# Patient Record
Sex: Male | Born: 1965 | Race: White | Hispanic: No | Marital: Single | Smoking: Current every day smoker
Health system: Southern US, Community
[De-identification: ages and names within clinical notes are randomized; demographics above are authoritative.]

## PROBLEM LIST (undated history)

## (undated) DIAGNOSIS — F17200 Nicotine dependence, unspecified, uncomplicated: Secondary | ICD-10-CM

## (undated) HISTORY — PX: ORIF RADIUS & ULNA FRACTURES: SHX2129

## (undated) HISTORY — PX: SKIN GRAFT: SHX250

---

## 2008-05-25 ENCOUNTER — Emergency Department: Payer: Self-pay | Admitting: Internal Medicine

## 2008-07-09 ENCOUNTER — Emergency Department: Payer: Self-pay | Admitting: Emergency Medicine

## 2010-07-04 ENCOUNTER — Emergency Department: Payer: Self-pay | Admitting: Emergency Medicine

## 2015-03-08 ENCOUNTER — Emergency Department
Admit: 2015-03-08 | Disposition: A | Discharge status: Home / Self Care | Payer: Self-pay | Admitting: Emergency Medicine

## 2016-01-25 ENCOUNTER — Emergency Department: Payer: No Typology Code available for payment source

## 2016-01-25 ENCOUNTER — Emergency Department
Admission: EM | Admit: 2016-01-25 | Discharge: 2016-01-26 | Disposition: A | Discharge status: Home / Self Care | Payer: No Typology Code available for payment source | Attending: Emergency Medicine | Admitting: Emergency Medicine

## 2016-01-25 DIAGNOSIS — IMO0002 Reserved for concepts with insufficient information to code with codable children: Secondary | ICD-10-CM

## 2016-01-25 DIAGNOSIS — S199XXA Unspecified injury of neck, initial encounter: Secondary | ICD-10-CM | POA: Diagnosis not present

## 2016-01-25 DIAGNOSIS — F10920 Alcohol use, unspecified with intoxication, uncomplicated: Secondary | ICD-10-CM

## 2016-01-25 DIAGNOSIS — Z23 Encounter for immunization: Secondary | ICD-10-CM | POA: Insufficient documentation

## 2016-01-25 DIAGNOSIS — S0990XA Unspecified injury of head, initial encounter: Secondary | ICD-10-CM | POA: Diagnosis not present

## 2016-01-25 DIAGNOSIS — S52292A Other fracture of shaft of left ulna, initial encounter for closed fracture: Secondary | ICD-10-CM | POA: Insufficient documentation

## 2016-01-25 DIAGNOSIS — S01312A Laceration without foreign body of left ear, initial encounter: Secondary | ICD-10-CM | POA: Insufficient documentation

## 2016-01-25 DIAGNOSIS — Y92410 Unspecified street and highway as the place of occurrence of the external cause: Secondary | ICD-10-CM | POA: Insufficient documentation

## 2016-01-25 DIAGNOSIS — Y9389 Activity, other specified: Secondary | ICD-10-CM | POA: Diagnosis not present

## 2016-01-25 DIAGNOSIS — Y998 Other external cause status: Secondary | ICD-10-CM | POA: Diagnosis not present

## 2016-01-25 DIAGNOSIS — S01111A Laceration without foreign body of right eyelid and periocular area, initial encounter: Secondary | ICD-10-CM | POA: Diagnosis not present

## 2016-01-25 DIAGNOSIS — F10120 Alcohol abuse with intoxication, uncomplicated: Secondary | ICD-10-CM | POA: Insufficient documentation

## 2016-01-25 DIAGNOSIS — Z880 Allergy status to penicillin: Secondary | ICD-10-CM | POA: Diagnosis not present

## 2016-01-25 DIAGNOSIS — S59912A Unspecified injury of left forearm, initial encounter: Secondary | ICD-10-CM | POA: Diagnosis present

## 2016-01-25 DIAGNOSIS — S52202A Unspecified fracture of shaft of left ulna, initial encounter for closed fracture: Secondary | ICD-10-CM

## 2016-01-25 MED ORDER — TETANUS-DIPHTH-ACELL PERTUSSIS 5-2.5-18.5 LF-MCG/0.5 IM SUSP
0.5000 mL | Freq: Once | INTRAMUSCULAR | Status: AC
  Administered 2016-01-25: 0.5 mL via INTRAMUSCULAR
  Filled 2016-01-25: qty 0.5

## 2016-01-25 MED ORDER — HALOPERIDOL LACTATE 5 MG/ML IJ SOLN
5.0000 mg | Freq: Once | INTRAMUSCULAR | Status: AC
  Administered 2016-01-25: 5 mg via INTRAMUSCULAR

## 2016-01-25 MED ORDER — DIPHENHYDRAMINE HCL 50 MG/ML IJ SOLN
50.0000 mg | Freq: Once | INTRAMUSCULAR | Status: AC
  Administered 2016-01-25: 50 mg via INTRAMUSCULAR

## 2016-01-25 MED ORDER — DIPHENHYDRAMINE HCL 50 MG/ML IJ SOLN
INTRAMUSCULAR | Status: AC
  Administered 2016-01-25: 50 mg via INTRAMUSCULAR
  Filled 2016-01-25: qty 1

## 2016-01-25 MED ORDER — LORAZEPAM 2 MG/ML IJ SOLN
2.0000 mg | Freq: Once | INTRAMUSCULAR | Status: AC
  Administered 2016-01-25: 2 mg via INTRAMUSCULAR

## 2016-01-25 NOTE — ED Notes (Signed)
[facility] in room with patient at this time
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
## 2016-01-25 NOTE — ED Notes (Signed)
Patient transported to x-ray. ?

## 2016-01-25 NOTE — ED Notes (Signed)
Blood draw performed for [facility]. Pt tolerated it well.

## 2016-01-26 MED ORDER — IBUPROFEN 800 MG PO TABS
800.0000 mg | ORAL_TABLET | Freq: Once | ORAL | Status: AC
  Administered 2016-01-26: 800 mg via ORAL
  Filled 2016-01-26: qty 1

## 2016-01-26 NOTE — ED Notes (Signed)
Pt requested water. Pt provided water to drink. Pt laying on stretcher, both side rails up. Pt resting at this time.

## 2016-01-26 NOTE — Discharge Instructions (Signed)
Alcohol Intoxication Alcohol intoxication occurs when you drink enough alcohol that it affects your ability to function. It can be mild or very severe. Drinking a lot of alcohol in a short time is called binge drinking. This can be very harmful. Drinking alcohol can also be more dangerous if you are taking medicines or other drugs. Some of the effects caused by alcohol may include:  Loss of coordination.  Changes in mood and behavior.  Unclear thinking.  Trouble talking (slurred speech).  Throwing up (vomiting).  Confusion.  Slowed breathing.  Twitching and shaking (seizures).  Loss of consciousness. HOME CARE  Do not drive after drinking alcohol.  Drink enough water and fluids to keep your pee (urine) clear or pale yellow. Avoid caffeine.  Only take medicine as told by your doctor. GET HELP IF:  You throw up (vomit) many times.  You do not feel better after a few days.  You frequently have alcohol intoxication. Your doctor can help decide if you should see a substance use treatment counselor. GET HELP RIGHT AWAY IF:  You become shaky when you stop drinking.  You have twitching and shaking.  You throw up blood. It may look bright red or like coffee grounds.  You notice blood in your poop (bowel movements).  You become lightheaded or pass out (faint). MAKE SURE YOU:   Understand these instructions.  Will watch your condition.  Will get help right away if you are not doing well or get worse.   This information is not intended to replace advice given to you by your health care provider. Make sure you discuss any questions you have with your health care provider.   Document Released: 04/15/2008 Document Revised: 06/30/2013 Document Reviewed: 04/02/2013 Elsevier Interactive Patient Education 2016 Elsevier Inc.  Cast or Splint Care Casts and splints support injured limbs and keep bones from moving while they heal.  HOME CARE  Keep the cast or splint uncovered  during the drying period.  A plaster cast can take 24 to 48 hours to dry.  A fiberglass cast will dry in less than 1 hour.  Do not rest the cast on anything harder than a pillow for 24 hours.  Do not put weight on your injured limb. Do not put pressure on the cast. Wait for your doctor's approval.  Keep the cast or splint dry.  Cover the cast or splint with a plastic bag during baths or wet weather.  If you have a cast over your chest and belly (trunk), take sponge baths until the cast is taken off.  If your cast gets wet, dry it with a towel or blow dryer. Use the cool setting on the blow dryer.  Keep your cast or splint clean. Wash a dirty cast with a damp cloth.  Do not put any objects under your cast or splint.  Do not scratch the skin under the cast with an object. If itching is a problem, use a blow dryer on a cool setting over the itchy area.  Do not trim or cut your cast.  Do not take out the padding from inside your cast.  Exercise your joints near the cast as told by your doctor.  Raise (elevate) your injured limb on 1 or 2 pillows for the first 1 to 3 days. GET HELP IF:  Your cast or splint cracks.  Your cast or splint is too tight or too loose.  You itch badly under the cast.  Your cast gets wet or has  a soft spot.  You have a bad smell coming from the cast.  You get an object stuck under the cast.  Your skin around the cast becomes red or sore.  You have new or more pain after the cast is put on. GET HELP RIGHT AWAY IF:  You have fluid leaking through the cast.  You cannot move your fingers or toes.  Your fingers or toes turn blue or white or are cool, painful, or puffy (swollen).  You have tingling or lose feeling (numbness) around the injured area.  You have bad pain or pressure under the cast.  You have trouble breathing or have shortness of breath.  You have chest pain.   This information is not intended to replace advice given to you  by your health care provider. Make sure you discuss any questions you have with your health care provider.   Document Released: 02/27/2011 Document Revised: 06/30/2013 Document Reviewed: 05/06/2013 Elsevier Interactive Patient Education 2016 Elsevier Inc.  Forearm Fracture A forearm fracture is a break in one or both of the bones of your arm that are between the elbow and the wrist. Your forearm is made up of two bones:  Radius. This is the bone on the inside of your arm near your thumb.  Ulna. This is the bone on the outside of your arm near your little finger. Middle forearm fractures usually break both the radius and the ulna. Most forearm fractures that involve both the ulna and radius will require surgery. CAUSES Common causes of this type of fracture include:  Falling on an outstretched arm.  Accidents, such as a car or bike accident.  A hard, direct hit to the middle part of your arm. RISK FACTORS You may be at higher risk for this type of fracture if:  You play contact sports.  You have a condition that causes your bones to be weak or thin (osteoporosis). SIGNS AND SYMPTOMS A forearm fracture causes pain immediately after the injury. Other signs and symptoms include:  An abnormal bend or bump in your arm (deformity).  Swelling.  Numbness or tingling.  Tenderness.  Inability to turn your hand from side to side (rotate).  Bruising. DIAGNOSIS Your health care provider may diagnose a forearm fracture based on:  Your symptoms.  Your medical history, including any recent injury.  A physical exam. Your health care provider will look for any deformity and feel for tenderness over the break. Your health care provider will also check whether the bones are out of place.  An X-ray exam to confirm the diagnosis and learn more about the type of fracture. TREATMENT The goals of treatment are to get the bone or bones in proper position for healing and to keep the bones  from moving so they will heal over time. Your treatment will depend on many factors, especially the type of fracture that you have.  If the fractured bone or bones:  Are in the correct position (nondisplaced), you may only need to wear a cast or a splint.  Have a slightly displaced fracture, you may need to have the bones moved back into place manually (closed reduction) before the splint or cast is put on.  You may have a temporary splint before you have a cast. The splint allows room for some swelling. After a few days, a cast can replace the splint.  You may have to wear the cast for 6-8 weeks or as directed by your health care provider.  The cast  may be changed after about 3 weeks or as directed by your health care provider.  After your cast is removed, you may need physical therapy to regain full movement in your wrist or elbow.  You may need emergency surgery if you have:  A fractured bone or bones that are out of position (displaced).  A fracture with multiple fragments (comminuted fracture).  A fracture that breaks the skin (open fracture). This type of fracture may require surgical wires, plates, or screws to hold the bone or bones in place.  You may have X-rays every couple of weeks to check on your healing. HOME CARE INSTRUCTIONS If You Have a Cast:  Do not stick anything inside the cast to scratch your skin. Doing that increases your risk of infection.  Check the skin around the cast every day. Report any concerns to your health care provider. You may put lotion on dry skin around the edges of the cast. Do not apply lotion to the skin underneath the cast. If You Have a Splint:  Wear it as directed by your health care provider. Remove it only as directed by your health care provider.  Loosen the splint if your fingers become numb and tingle, or if they turn cold and blue. Bathing  Cover the cast or splint with a watertight plastic bag to protect it from water while  you bathe or shower. Do not let the cast or splint get wet. Managing Pain, Stiffness, and Swelling  If directed, apply ice to the injured area:  Put ice in a plastic bag.  Place a towel between your skin and the bag.  Leave the ice on for 20 minutes, 2-3 times a day.  Move your fingers often to avoid stiffness and to lessen swelling.  Raise the injured area above the level of your heart while you are sitting or lying down. Driving  Do not drive or operate heavy machinery while taking pain medicine.  Do not drive while wearing a cast or splint on a hand that you use for driving. Activity  Return to your normal activities as directed by your health care provider. Ask your health care provider what activities are safe for you.  Perform range-of-motion exercises only as directed by your health care provider. Safety  Do not use your injured limb to support your body weight until your health care provider says that you can. General Instructions  Do not put pressure on any part of the cast or splint until it is fully hardened. This may take several hours.  Keep the cast or splint clean and dry.  Do not use any tobacco products, including cigarettes, chewing tobacco, or electronic cigarettes. Tobacco can delay bone healing. If you need help quitting, ask your health care provider.  Take medicines only as directed by your health care provider.  Keep all follow-up visits as directed by your health care provider. This is important. SEEK MEDICAL CARE IF:  Your pain medicine is not helping.  Your cast or splint becomes wet or damaged or suddenly feels too tight.  Your cast becomes loose.  You have more severe pain or swelling than you did before the cast.  You have severe pain when you stretch your fingers.  You continue to have pain or stiffness in your elbow or your wrist after your cast is removed. SEEK IMMEDIATE MEDICAL CARE IF:  You cannot move your fingers.  You lose  feeling in your fingers or your hand.  Your hand or your  fingers turn cold and pale or blue.  You notice a bad smell coming from your cast.  You have drainage from underneath your cast.  You have new stains from blood or drainage that is coming through your cast.   This information is not intended to replace advice given to you by your health care provider. Make sure you discuss any questions you have with your health care provider.   Document Released: 10/25/2000 Document Revised: 11/18/2014 Document Reviewed: 06/13/2014 Elsevier Interactive Patient Education [employer]

## 2016-01-26 NOTE — ED Notes (Signed)
Pt is sleeping in stretcher with side rails up, warm blanket covering patient. Equal rise and fall of chest noted, no distress at this time.

## 2016-01-26 NOTE — ED Notes (Signed)
Patient given instructions on follow-up with Dr. Martha ClanKrasinski, to not mix vicodin with alcohol or drive while intoxicated with alcohol or other substances including pain medication.
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
## 2016-01-26 NOTE — ED Notes (Signed)
Pt is attempting to call a ride home.
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
## 2016-05-07 ENCOUNTER — Encounter: Payer: Self-pay | Admitting: Emergency Medicine

## 2016-05-07 ENCOUNTER — Emergency Department
Admission: EM | Admit: 2016-05-07 | Discharge: 2016-05-07 | Disposition: A | Discharge status: Home / Self Care | Payer: No Typology Code available for payment source | Attending: Emergency Medicine | Admitting: Emergency Medicine

## 2016-05-07 DIAGNOSIS — Z0189 Encounter for other specified special examinations: Secondary | ICD-10-CM

## 2016-05-07 DIAGNOSIS — L03311 Cellulitis of abdominal wall: Secondary | ICD-10-CM | POA: Insufficient documentation

## 2016-05-07 DIAGNOSIS — Z7689 Persons encountering health services in other specified circumstances: Secondary | ICD-10-CM

## 2016-05-07 DIAGNOSIS — F17200 Nicotine dependence, unspecified, uncomplicated: Secondary | ICD-10-CM | POA: Insufficient documentation

## 2016-05-07 MED ORDER — HYDROCODONE-ACETAMINOPHEN 5-325 MG PO TABS
ORAL_TABLET | ORAL | Status: AC
  Administered 2016-05-07: 1 via ORAL
  Filled 2016-05-07: qty 1

## 2016-05-07 MED ORDER — HYDROCODONE-ACETAMINOPHEN 5-325 MG PO TABS: 1.0000 | ORAL_TABLET | Freq: Three times a day (TID) | ORAL | Status: DC | PRN

## 2016-05-07 MED ORDER — LIDOCAINE-EPINEPHRINE (PF) 1 %-1:200000 IJ SOLN
INTRAMUSCULAR | Status: AC
  Filled 2016-05-07: qty 30

## 2016-05-07 MED ORDER — CLINDAMYCIN HCL 300 MG PO CAPS: 300.0000 mg | ORAL_CAPSULE | Freq: Four times a day (QID) | ORAL | Status: DC

## 2016-05-07 MED ORDER — LIDOCAINE-EPINEPHRINE 2 %-1:100000 IJ SOLN
1.7000 mL | Freq: Once | INTRAMUSCULAR | Status: AC
  Administered 2016-05-07: 1.7 mL

## 2016-05-07 MED ORDER — CLINDAMYCIN HCL 150 MG PO CAPS
300.0000 mg | ORAL_CAPSULE | Freq: Once | ORAL | Status: AC
  Administered 2016-05-07: 300 mg via ORAL
  Filled 2016-05-07: qty 2

## 2016-05-07 MED ORDER — HYDROCODONE-ACETAMINOPHEN 5-325 MG PO TABS
1.0000 | ORAL_TABLET | Freq: Once | ORAL | Status: AC
  Administered 2016-05-07: 1 via ORAL

## 2016-05-07 NOTE — ED Notes (Signed)
Patient states he was prescribed antibiotic yesterday, but couldn't get the prescription filled due to finances.

## 2016-05-07 NOTE — Discharge Instructions (Signed)
Cellulitis Cellulitis is an infection of the skin and the tissue beneath it. The infected area is usually red and tender. Cellulitis occurs most often in the arms and lower legs.  CAUSES  Cellulitis is caused by bacteria that enter the skin through cracks or cuts in the skin. The most common types of bacteria that cause cellulitis are staphylococci and streptococci. SIGNS AND SYMPTOMS   Redness and warmth.  Swelling.  Tenderness or pain.  Fever. DIAGNOSIS  Your health care provider can usually determine what is wrong based on a physical exam. Blood tests may also be done. TREATMENT  Treatment usually involves taking an antibiotic medicine. HOME CARE INSTRUCTIONS   Take your antibiotic medicine as directed by your health care provider. Finish the antibiotic even if you start to feel better.  Keep the infected arm or leg elevated to reduce swelling.  Apply a warm cloth to the affected area up to 4 times per day to relieve pain.  Take medicines only as directed by your health care provider.  Keep all follow-up visits as directed by your health care provider. SEEK MEDICAL CARE IF:   You notice red streaks coming from the infected area.  Your red area gets larger or turns dark in color.  Your bone or joint underneath the infected area becomes painful after the skin has healed.  Your infection returns in the same area or another area.  You notice a swollen bump in the infected area.  You develop new symptoms.  You have a fever. SEEK IMMEDIATE MEDICAL CARE IF:   You feel very sleepy.  You develop vomiting or diarrhea.  You have a general ill feeling (malaise) with muscle aches and pains.   This information is not intended to replace advice given to you by your health care provider. Make sure you discuss any questions you have with your health care provider.   Document Released: 08/07/2005 Document Revised: 07/19/2015 Document Reviewed: 01/13/2012 Elsevier Interactive  Patient Education [employer]2016 [employer]   Take all of the antibiotic pills prescribed. Keep the wound clean, dry, and covered. Apply warm compresses to promote healing. Monitor for spread of infection, fevers, or weakness. Follow-up with one of the community clinics for routine care. Remove the wound packing in 3 days or return to the ED if needed.

## 2016-05-07 NOTE — ED Notes (Signed)
Possible insect bite to abd  Large red area noted to lower abd   [facility] but not able to afford antibiotics

## 2016-05-07 NOTE — ED Provider Notes (Signed)
[facility] Emergency Department Provider Note ____________________________________________  Time seen: 1715  I have reviewed the triage vital signs and the nursing notes.  HISTORY  Chief Complaint  Abscess  HPI Richard Buchanan is a 50 y.o. male presents to the ED By father for evaluation of a area of cellulitis and abscess to the abdomen Is initially evaluated yesterday. He denies any particular onset but notes that he had several insect bites to his lower abdomen earlier in the week. From the time he began to develop a large red area to the left side of his umbilicus. [place] due to increased redness, tenderness, warmth, and firmness to the skin surrounding the umbilicus. He describes a local incision procedure including very minimal purulent drainage but mostly blood. He describes he was given a prescription for clindamycin topical ointment as well as clindamycin tablets. He describes not getting either prescription fields once he realized how extensive the clindamycin ointment was. He returns today with increasing redness beyond the borders of the skin marker placed by the provider yesterday. He denies any interim fevers, chills, or sweats. He does note increased pain to the abdomen secondary to the localized redness. He rates discomfort as a 6/10 in triage.  History reviewed. No pertinent past medical history.  There are no active problems to display for this patient.   History reviewed. No pertinent past surgical history.  Current Outpatient Rx  Name  Route  Sig  Dispense  Refill  . clindamycin (CLEOCIN) 300 MG capsule   Oral   Take 1 capsule (300 mg total) by mouth 4 (four) times daily.   40 capsule   0   . HYDROcodone-acetaminophen (NORCO) 5-325 MG tablet   Oral   Take 1 tablet by mouth 3 (three) times daily as needed for moderate pain.   10 tablet   0     Allergies Penicillins  No family history on file.  Social  History Social History  Substance Use Topics  . Smoking status: Current Every Day Smoker  . Smokeless tobacco: None  . Alcohol Use: Yes    Review of Systems  Constitutional: Negative for fever. Cardiovascular: Negative for chest pain. Respiratory: Negative for shortness of breath. Gastrointestinal: Negative for abdominal pain, vomiting and diarrhea. Musculoskeletal: Negative for back pain. Skin: Negative for rash. Positive for redness and induration as above. Neurological: Negative for headaches, focal weakness or numbness. ____________________________________________  PHYSICAL EXAM:  VITAL SIGNS: ED Triage Vitals  Enc Vitals Group     BP 05/07/16 1654 144/91 mmHg     Pulse Rate 05/07/16 1654 100     Resp 05/07/16 1654 20     Temp 05/07/16 1654 98 F (36.7 C)     Temp Source 05/07/16 1654 Oral     SpO2 05/07/16 1654 97 %     Weight 05/07/16 1654 212 lb (96.163 kg)     Height 05/07/16 1654 5\' 11"  (1.803 m)     Head Cir --      Peak Flow --      Pain Score 05/07/16 1654 6     Pain Loc --      Pain Edu? --      Excl. in GC? --    Constitutional: Alert and oriented. Well appearing and in no distress. Head: Normocephalic and atraumatic. Cardiovascular: Normal rate, regular rhythm.  Respiratory: Normal respiratory effort. No wheezes/rales/rhonchi. Gastrointestinal: Soft and nontender. No distention. Musculoskeletal: Nontender with normal range of motion in  all extremities.  Neurologic:  Normal gait without ataxia. Normal speech and language. No gross focal neurologic deficits are appreciated. Skin:  Skin is warm, dry and intact. Patient with a well demarcated area of erythema to the skin surrounding the lower abdomen and umbilicus. He does have a firm indurated lesion to the left lateral aspect of the umbilicus. There is a local scab noted there but no fluctuance is appreciated. He does have an area measuring about 10 cm in diameter that is marked with a skin marker. He has  an area extending beyond the skin marker measuring no more than 1 cm at the widest point. ___________________________________________   LABS (pertinent positives/negatives) Labs Reviewed  AEROBIC/ANAEROBIC CULTURE (SURGICAL/DEEP WOUND)  ____________________________________________  PROCEDURES  Clindamycin 300 mg PO Norco 5-325 mg PO  INCISION AND DRAINAGE Performed by: Lissa HoardMenshew, Danaija Eskridge V Bacon Consent: Verbal consent obtained. Risks and benefits: risks, benefits and alternatives were discussed Type: abscess  Body area: periumbilical & lower abdomen  Anesthesia: local infiltration  Incision was made with a scalpel.  Local anesthetic: lidocaine 1% w/ epinephrine  Anesthetic total: 6 ml  Complexity: complex Blunt dissection to break up loculations  Drainage: purulent  Drainage amount: minimal  Packing material: 1/4 in iodoform gauze  Patient tolerance: Patient tolerated the procedure well with no immediate complications. ____________________________________________  INITIAL IMPRESSION / ASSESSMENT AND PLAN / ED COURSE  Patient with an acute cephalitis of the abdominal wall. The area has been recently I&D procedure was again repeated today with some minimal purulent return. The wound area this time is packed with iodoform strip promote healing. The patient is discharged with a prescription for clindamycin and hydrocodone # 10. His is also provided with a discount prescription coupon which should last him to get the Cleocin fields for no more than $20. He is advised that he does not EDU denies topical Cleocin gel. He is advised to try warm compresses to promote healing. He should remove the packing in about 3 days or return to the ED for wound check as needed. ____________________________________________  FINAL CLINICAL IMPRESSION(S) / ED DIAGNOSES  Final diagnoses:  Cellulitis of abdominal wall  Encounter for incision and drainage procedure     Lissa HoardJenise V Bacon  Owen Pagnotta, PA-C 05/08/16 0006  Governor Rooksebecca Lord, MD 05/08/16 1610

## 2016-05-12 LAB — AEROBIC/ANAEROBIC CULTURE (SURGICAL/DEEP WOUND)

## 2016-05-12 LAB — AEROBIC/ANAEROBIC CULTURE W GRAM STAIN (SURGICAL/DEEP WOUND): Special Requests: NORMAL
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
## 2016-12-24 ENCOUNTER — Inpatient Hospital Stay
Admission: EM | Admit: 2016-12-24 | Discharge: 2016-12-25 | DRG: 193 | Disposition: A | Discharge status: Home / Self Care | Payer: Self-pay | Attending: Internal Medicine | Admitting: Internal Medicine

## 2016-12-24 ENCOUNTER — Emergency Department: Payer: Self-pay

## 2016-12-24 DIAGNOSIS — R69 Illness, unspecified: Secondary | ICD-10-CM

## 2016-12-24 DIAGNOSIS — J209 Acute bronchitis, unspecified: Secondary | ICD-10-CM | POA: Diagnosis present

## 2016-12-24 DIAGNOSIS — F17210 Nicotine dependence, cigarettes, uncomplicated: Secondary | ICD-10-CM | POA: Diagnosis present

## 2016-12-24 DIAGNOSIS — Z833 Family history of diabetes mellitus: Secondary | ICD-10-CM

## 2016-12-24 DIAGNOSIS — R0902 Hypoxemia: Secondary | ICD-10-CM

## 2016-12-24 DIAGNOSIS — J9601 Acute respiratory failure with hypoxia: Secondary | ICD-10-CM | POA: Diagnosis present

## 2016-12-24 DIAGNOSIS — J40 Bronchitis, not specified as acute or chronic: Secondary | ICD-10-CM

## 2016-12-24 DIAGNOSIS — E860 Dehydration: Secondary | ICD-10-CM | POA: Diagnosis present

## 2016-12-24 DIAGNOSIS — J111 Influenza due to unidentified influenza virus with other respiratory manifestations: Secondary | ICD-10-CM | POA: Diagnosis present

## 2016-12-24 DIAGNOSIS — Z880 Allergy status to penicillin: Secondary | ICD-10-CM

## 2016-12-24 DIAGNOSIS — Z801 Family history of malignant neoplasm of trachea, bronchus and lung: Secondary | ICD-10-CM

## 2016-12-24 DIAGNOSIS — M6281 Muscle weakness (generalized): Secondary | ICD-10-CM

## 2016-12-24 DIAGNOSIS — J101 Influenza due to other identified influenza virus with other respiratory manifestations: Principal | ICD-10-CM | POA: Diagnosis present

## 2016-12-24 DIAGNOSIS — Z825 Family history of asthma and other chronic lower respiratory diseases: Secondary | ICD-10-CM

## 2016-12-24 HISTORY — DX: Nicotine dependence, unspecified, uncomplicated: F17.200

## 2016-12-24 LAB — INFLUENZA PANEL BY PCR (TYPE A & B)
INFLBPCR: NEGATIVE
Influenza A By PCR: POSITIVE — AB

## 2016-12-24 LAB — CBC
HCT: 39.6 % — ABNORMAL LOW (ref 40.0–52.0)
Hemoglobin: 13.5 g/dL (ref 13.0–18.0)
MCH: 31.3 pg (ref 26.0–34.0)
MCHC: 34.1 g/dL (ref 32.0–36.0)
MCV: 91.8 fL (ref 80.0–100.0)
PLATELETS: 308 10*3/uL (ref 150–440)
RBC: 4.32 MIL/uL — AB (ref 4.40–5.90)
RDW: 12.4 % (ref 11.5–14.5)
WBC: 18.1 10*3/uL — AB (ref 3.8–10.6)

## 2016-12-24 LAB — BASIC METABOLIC PANEL
ANION GAP: 7 (ref 5–15)
BUN: 9 mg/dL (ref 6–20)
CALCIUM: 8.7 mg/dL — AB (ref 8.9–10.3)
CO2: 27 mmol/L (ref 22–32)
Chloride: 103 mmol/L (ref 101–111)
Creatinine, Ser: 0.81 mg/dL (ref 0.61–1.24)
GFR calc Af Amer: >60 mL/min (ref >60)
Glucose, Bld: 107 mg/dL — ABNORMAL HIGH (ref 65–99)
POTASSIUM: 4.6 mmol/L (ref 3.5–5.1)
SODIUM: 137 mmol/L (ref 135–145)

## 2016-12-24 LAB — MRSA PCR SCREENING: MRSA by PCR: NEGATIVE

## 2016-12-24 LAB — TROPONIN I: Troponin I: <0.03 ng/mL (ref <0.03)

## 2016-12-24 MED ORDER — IPRATROPIUM-ALBUTEROL 0.5-2.5 (3) MG/3ML IN SOLN
3.0000 mL | Freq: Once | RESPIRATORY_TRACT | Status: AC
  Administered 2016-12-24: 3 mL via RESPIRATORY_TRACT
  Filled 2016-12-24: qty 3

## 2016-12-24 MED ORDER — SODIUM CHLORIDE 0.9 % IV SOLN
INTRAVENOUS | Status: AC
  Administered 2016-12-24 – 2016-12-25 (×3): via INTRAVENOUS

## 2016-12-24 MED ORDER — ENOXAPARIN SODIUM 40 MG/0.4ML ~~LOC~~ SOLN
40.0000 mg | SUBCUTANEOUS | Status: DC
  Administered 2016-12-24 – 2016-12-25 (×2): 40 mg via SUBCUTANEOUS
  Filled 2016-12-24 (×2): qty 0.4

## 2016-12-24 MED ORDER — NICOTINE 21 MG/24HR TD PT24
21.0000 mg | MEDICATED_PATCH | Freq: Every day | TRANSDERMAL | Status: DC
  Administered 2016-12-24 – 2016-12-25 (×2): 21 mg via TRANSDERMAL
  Filled 2016-12-24 (×2): qty 1

## 2016-12-24 MED ORDER — IBUPROFEN 600 MG PO TABS
600.0000 mg | ORAL_TABLET | Freq: Once | ORAL | Status: AC
  Administered 2016-12-24: 600 mg via ORAL
  Filled 2016-12-24: qty 1

## 2016-12-24 MED ORDER — IPRATROPIUM-ALBUTEROL 0.5-2.5 (3) MG/3ML IN SOLN
3.0000 mL | RESPIRATORY_TRACT | Status: DC
  Administered 2016-12-24 (×4): 3 mL via RESPIRATORY_TRACT
  Filled 2016-12-24 (×4): qty 3

## 2016-12-24 MED ORDER — ORAL CARE MOUTH RINSE
15.0000 mL | Freq: Two times a day (BID) | OROMUCOSAL | Status: DC
  Administered 2016-12-24 (×2): 15 mL via OROMUCOSAL

## 2016-12-24 MED ORDER — AZITHROMYCIN 500 MG IV SOLR
500.0000 mg | Freq: Once | INTRAVENOUS | Status: AC
  Administered 2016-12-24: 500 mg via INTRAVENOUS
  Filled 2016-12-24: qty 500

## 2016-12-24 MED ORDER — METHYLPREDNISOLONE SODIUM SUCC 125 MG IJ SOLR
125.0000 mg | Freq: Once | INTRAMUSCULAR | Status: AC
  Administered 2016-12-24: 125 mg via INTRAVENOUS
  Filled 2016-12-24: qty 2

## 2016-12-24 MED ORDER — IPRATROPIUM-ALBUTEROL 0.5-2.5 (3) MG/3ML IN SOLN
3.0000 mL | Freq: Four times a day (QID) | RESPIRATORY_TRACT | Status: DC
  Administered 2016-12-25 (×2): 3 mL via RESPIRATORY_TRACT
  Filled 2016-12-24 (×2): qty 3

## 2016-12-24 MED ORDER — AZITHROMYCIN 250 MG PO TABS
250.0000 mg | ORAL_TABLET | Freq: Every day | ORAL | Status: DC
  Administered 2016-12-25: 250 mg via ORAL
  Filled 2016-12-24: qty 1

## 2016-12-24 MED ORDER — OSELTAMIVIR PHOSPHATE 75 MG PO CAPS
75.0000 mg | ORAL_CAPSULE | Freq: Two times a day (BID) | ORAL | Status: DC
  Administered 2016-12-24 – 2016-12-25 (×3): 75 mg via ORAL
  Filled 2016-12-24 (×4): qty 1

## 2016-12-24 MED ORDER — AZITHROMYCIN 250 MG PO TABS
500.0000 mg | ORAL_TABLET | Freq: Every day | ORAL | Status: AC
  Administered 2016-12-24: 500 mg via ORAL
  Filled 2016-12-24: qty 2

## 2016-12-24 MED ORDER — PNEUMOCOCCAL VAC POLYVALENT 25 MCG/0.5ML IJ INJ: 0.5000 mL | INJECTION | INTRAMUSCULAR | Status: DC

## 2016-12-24 MED ORDER — ONDANSETRON HCL 4 MG PO TABS: 4.0000 mg | ORAL_TABLET | Freq: Four times a day (QID) | ORAL | Status: DC | PRN

## 2016-12-24 MED ORDER — ONDANSETRON HCL 4 MG/2ML IJ SOLN: 4.0000 mg | Freq: Four times a day (QID) | INTRAMUSCULAR | Status: DC | PRN

## 2016-12-24 MED ORDER — ACETAMINOPHEN 650 MG RE SUPP: 650.0000 mg | Freq: Four times a day (QID) | RECTAL | Status: DC | PRN

## 2016-12-24 MED ORDER — IBUPROFEN 400 MG PO TABS
400.0000 mg | ORAL_TABLET | Freq: Four times a day (QID) | ORAL | Status: DC | PRN
  Filled 2016-12-24 (×2): qty 1

## 2016-12-24 MED ORDER — SODIUM CHLORIDE 0.9 % IV BOLUS (SEPSIS)
1000.0000 mL | Freq: Once | INTRAVENOUS | Status: AC
  Administered 2016-12-24: 1000 mL via INTRAVENOUS

## 2016-12-24 MED ORDER — ACETAMINOPHEN 325 MG PO TABS
650.0000 mg | ORAL_TABLET | Freq: Four times a day (QID) | ORAL | Status: DC | PRN
  Administered 2016-12-24: 650 mg via ORAL
  Filled 2016-12-24 (×2): qty 2

## 2016-12-24 MED ORDER — ACETAMINOPHEN 500 MG PO TABS
1000.0000 mg | ORAL_TABLET | Freq: Once | ORAL | Status: AC
  Administered 2016-12-24: 1000 mg via ORAL
  Filled 2016-12-24: qty 2

## 2016-12-24 NOTE — ED Provider Notes (Signed)
[facility] Emergency Department Provider Note   ____________________________________________   First MD Initiated Contact with Patient 12/24/16 0119     (approximate)  I have reviewed the triage vital signs and the nursing notes.   HISTORY  Chief Complaint Shortness of Breath and Nasal Congestion    HPI Richard Buchanan is a 51 y.o. male who comes into the hospital today with some shortness of breath. He reports that he's been having flulike symptoms for the past 4 days. He's had some chills with elevated temperature. He reports that it was 100.3 200.9. He reports that he was feeling weak and had been taking Tylenol so he decided to go to his girlfriend's house so she could care for him. He reports that he also has been taking DayQuil and Mucinex DM. He slept he says for the past 4 days and reports that he still feeling unwell. He reports that he's been feeling as if he is delusional and wakes up a little confused. Tonight he had negative with his girlfriend after she was checking his phone and the asked him to leave the house. He reports he walked from [place] to [place] and developed some wheezing and shortness of breath. He reports that he is unable to go to his mother's home because she has cancer and he could not find anywhere else to go. He was unsure what was causing this chest tightness and shortness of breath he decided to come in and get checked out.    History reviewed. No pertinent past medical history.  There are no active problems to display for this patient.   History reviewed. No pertinent surgical history.  Prior to Admission medications   Not on File    Allergies Penicillins  No family history on file.  Social History Social History  Substance Use Topics  . Smoking status: Current Every Day Smoker  . Smokeless tobacco: Former NeurosurgeonUser  . Alcohol use Yes    Review of Systems Constitutional: fever/chills Eyes: No visual  changes. ENT: No sore throat. Cardiovascular:  chest pain. Respiratory:  shortness of breath. Gastrointestinal: No abdominal pain.  No nausea, no vomiting.  No diarrhea.  No constipation. Genitourinary: Negative for dysuria. Musculoskeletal: body aches Skin: Negative for rash. Neurological: Headache  10-point ROS otherwise negative.  ____________________________________________   PHYSICAL EXAM:  VITAL SIGNS: ED Triage Vitals  Enc Vitals Group     BP 12/24/16 0041 139/81     Pulse Rate 12/24/16 0041 93     Resp 12/24/16 0041 15     Temp 12/24/16 0041 97.9 F (36.6 C)     Temp Source 12/24/16 0041 Oral     SpO2 12/24/16 0041 99 %     Weight 12/24/16 0042 218 lb (98.9 kg)     Height 12/24/16 0042 5\' 10"  (1.778 m)     Head Circumference --      Peak Flow --      Pain Score 12/24/16 0042 0     Pain Loc --      Pain Edu? --      Excl. in GC? --     Constitutional: Alert and oriented. Ill appearing and in moderate distress. Eyes: Conjunctivae are normal. PERRL. EOMI. Head: Atraumatic. Nose: No congestion/rhinnorhea. Mouth/Throat: Mucous membranes are moist.  Oropharynx non-erythematous. Cardiovascular: Normal rate, regular rhythm. Grossly normal heart sounds.  Good peripheral circulation. Respiratory: Normal respiratory effort.  No retractions.Expiratory wheezes in all lung fields Gastrointestinal: Soft and nontender. No distention. Positive  bowel sounds Musculoskeletal: No lower extremity tenderness nor edema.   Neurologic:  Normal speech and language.  Skin:  Skin is warm, dry and intact.  Psychiatric: Mood and affect are normal.   ____________________________________________   LABS (all labs ordered are listed, but only abnormal results are displayed)  Labs Reviewed  CBC - Abnormal; Notable for the following:       Result Value   WBC 18.1 (*)    RBC 4.32 (*)    HCT 39.6 (*)    All other components within normal limits  BASIC METABOLIC PANEL - Abnormal;  Notable for the following:    Glucose, Bld 107 (*)    Calcium 8.7 (*)    All other components within normal limits  INFLUENZA PANEL BY PCR (TYPE A & B) - Abnormal; Notable for the following:    Influenza A By PCR POSITIVE (*)    All other components within normal limits  BLOOD GAS, VENOUS - Abnormal; Notable for the following:    Acid-Base Excess 2.2 (*)    All other components within normal limits  CULTURE, BLOOD (ROUTINE X 2)  CULTURE, BLOOD (ROUTINE X 2)  TROPONIN I   ____________________________________________  EKG  ED ECG REPORT I, Rebecka Apley, the attending physician, personally viewed and interpreted this ECG.   Date: 12/24/2016  EKG Time: 0044  Rate: 93  Rhythm: normal sinus rhythm  Axis: left axis deviatio  Intervals:none  ST&T Change: none  ____________________________________________  RADIOLOGY  CXR ____________________________________________   PROCEDURES  Procedure(s) performed: None  Procedures  Critical Care performed: No  ____________________________________________   INITIAL IMPRESSION / ASSESSMENT AND PLAN / ED COURSE  Pertinent labs & imaging results that were available during my care of the patient were reviewed by me and considered in my medical decision making (see chart for details).  This is a 51 year old male who comes into the hospital today with some flulike symptoms and shortness of breath. The patient also has been having some wheezing. I did give the patient some Solu-Medrol and DuoNeb. I checked the patient for fluid that was positive. I will give the patient a liter of normal saline. He is too far out at this time to receive Tamiflu I will continue to monitor the patient. I will give him some ibuprofen as well.  Clinical Course as of Dec 25 723  Tue Dec 24, 2016  0234 Bronchitic changes without focal consolidation. DG Chest 2 View [AW]    Clinical Course User Index [AW] Rebecka Apley, MD   The patient has been  hypoxic with a sat in the high 80s. I gave him a second neb treatment but his sats are still in the 80s. The patient falls asleep but when he wakes up his sats are in the 80s. I will give him a third DuoNeb treatment as well as some azithromycin. I will admit the patient to the hospitalist service for some hypoxia bronchitis and influenza.  ____________________________________________   FINAL CLINICAL IMPRESSION(S) / ED DIAGNOSES  Final diagnoses:  Bronchitis  Influenza      NEW MEDICATIONS STARTED DURING THIS VISIT:  New Prescriptions   No medications on file     Note:  This document was prepared using Dragon voice recognition software and may include unintentional dictation errors.    Rebecka Apley, MD 12/24/16 [id number]
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
## 2016-12-24 NOTE — Evaluation (Signed)
Physical Therapy Evaluation Patient Details Name: Richard SousaJeffery L Buchanan MRN: [id number] DOB: [birth year] Today's Date: 12/24/2016   History of Present Illness  Pt is a 51 y.o. male with no significant past medical history other than smoking, presents to hospital secondary to fevers, chills, worsening shortness of breath and chest pain going on for almost 4-5 days now.  Pt tested positive for the flu and chest x-ray was showing signs of bronchitis.     Clinical Impression  Pt Ind and confident with bed mobility, transfers, and gait.  Pt ambulated 60' in room without AD with 180 turns while talking without SOB or LOB.  Pt able to perform SLS for >10 sec on BLEs without signs of instability.  Pt performed dynamic standing tasks including reaching significantly outside BOS with good stability.  Pt reports feeling much improved and close to baseline at this time and does not wish to receive PT services going forward.  Will discharge PT orders and will reassess at a future date/time with orders as appropriate if there is a change in status.       Follow Up Recommendations No PT follow up    Equipment Recommendations  None recommended by PT    Recommendations for Other Services       Precautions / Restrictions Precautions Precautions: None Restrictions Weight Bearing Restrictions: No      Mobility  Bed Mobility Overal bed mobility: Independent                Transfers Overall transfer level: Independent                  Ambulation/Gait Ambulation/Gait assistance: Independent Ambulation Distance (Feet): 60 Feet Assistive device: None Gait Pattern/deviations: WFL(Within Functional Limits)   Gait velocity interpretation: at or above normal speed for age/gender General Gait Details: Pt ambulated in room without AD 60' including 180 deg turns without LOB  Stairs            Wheelchair Mobility    Modified Rankin (Stroke Patients Only)       Balance Overall balance  assessment: Independent                                           Pertinent Vitals/Pain Pain Assessment: No/denies pain    Home Living Family/patient expects to be discharged to:: Private residence Living Arrangements: Alone   Type of Home: Mobile home Home Access: Ramped entrance     Home Layout: One level Home Equipment: None      Prior Function Level of Independence: Independent         Comments: Pt Ind with amb without an AD, no fall history, Ind with ADLs     Hand Dominance        Extremity/Trunk Assessment   Upper Extremity Assessment Upper Extremity Assessment: Overall WFL for tasks assessed    Lower Extremity Assessment Lower Extremity Assessment: Overall WFL for tasks assessed       Communication   Communication: No difficulties  Cognition Arousal/Alertness: Awake/alert Behavior During Therapy: WFL for tasks assessed/performed Overall Cognitive Status: Within Functional Limits for tasks assessed                      General Comments General comments (skin integrity, edema, etc.): Pt able to perform SLS > 10 sec on BLEs     Exercises  Assessment/Plan    PT Assessment Patent does not need any further PT services  PT Problem List            PT Treatment Interventions      PT Goals (Current goals can be found in the Care Plan section)       Frequency     Barriers to discharge        Co-evaluation               End of Session   Activity Tolerance: Patient tolerated treatment well Patient left: in bed;with call bell/phone within reach           Time: 1340-1404 PT Time Calculation (min) (ACUTE ONLY): 24 min   Charges:   PT Evaluation $PT Eval Low Complexity: 1 Procedure     PT G Codes:        DElly Modena PT, DPT 12/24/16, 3:25 PM
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
## 2016-12-24 NOTE — H&P (Signed)
[facility] [place] at [facility]   PATIENT NAME: Richard Buchanan    MR#:  [id number]  DATE OF BIRTH:  1966/02/07  DATE OF ADMISSION:  12/24/2016  PRIMARY CARE PHYSICIAN: No PCP Per Patient   REQUESTING/REFERRING PHYSICIAN: Dr. Lucrezia Europe  CHIEF COMPLAINT:   Chief Complaint  Patient presents with  . Shortness of Breath  . Nasal Congestion    HISTORY OF PRESENT ILLNESS:  Richard Buchanan  is a 51 y.o. male with No significant past medical history other than smoking, presents to hospital secondary to fevers, Buchanan, worsening shortness of breath and chest pain going on for almost 4-5 days now. Patient stays with his family and has been exposed to his father who also has been coughing for a few days. His dad was started on antibiotics for bronchitis. Richard Buchanan, Richard for the last 2-3 days. He says he is waking up confused every morning, dehydrated. Decreased appetite. Also having dry cough and worsening shortness of breath. His symptoms got worse since last night and so presented to the emergency room. He was hypoxic in the emergency room with saturations of 88% on room air. His Flu test was positive and chest x-ray was showing signs of bronchitis. So being admitted for the same.  PAST MEDICAL HISTORY:   Past Medical History:  Diagnosis Date  . Tobacco use disorder     PAST SURGICAL HISTORY:   Past Surgical History:  Procedure Laterality Date  . ORIF RADIUS & ULNA FRACTURES Left   . SKIN GRAFT     right FA and right upper leg    SOCIAL HISTORY:   Social History  Substance Use Topics  . Smoking status: Current Every Day Smoker    Packs/day: 2.00  . Smokeless tobacco: Former Neurosurgeon  . Alcohol use Yes    FAMILY HISTORY:   Family History  Problem Relation Age of Onset  . Lung cancer Mother   . Diabetes Father     DRUG ALLERGIES:   Allergies  Allergen Reactions  . Penicillins Rash and Other (See Comments)    Reaction:  Hypotension Has patient had a PCN reaction causing immediate rash, facial/tongue/throat swelling, SOB or lightheadedness with hypotension: Yes Has patient had a PCN reaction causing severe rash involving mucus membranes or skin necrosis: No Has patient had a PCN reaction that required hospitalization No Has patient had a PCN reaction occurring within the last 10 years: Yes If all of the above answers are "NO", then may proceed with Cephalosporin use.     REVIEW OF SYSTEMS:   Review of Systems  Constitutional: Positive for Buchanan and malaise/fatigue. Negative for fever and weight loss.  HENT: Negative for ear discharge, ear pain, hearing loss, nosebleeds and tinnitus.   Eyes: Negative for blurred vision, double vision and photophobia.  Respiratory: Positive for cough and shortness of breath. Negative for hemoptysis and wheezing.   Cardiovascular: Positive for chest pain. Negative for palpitations, orthopnea and leg swelling.  Gastrointestinal: Positive for nausea. Negative for abdominal pain, constipation, diarrhea, heartburn, melena and vomiting.  Genitourinary: Negative for dysuria, frequency, hematuria and urgency.  Musculoskeletal: Positive for Richard. Negative for back pain and neck pain.  Skin: Negative for rash.  Neurological: Negative for dizziness, tingling, tremors, sensory change, speech change, focal weakness and headaches.  Endo/Heme/Allergies: Does not bruise/bleed easily.  Psychiatric/Behavioral: Negative for depression.    MEDICATIONS AT HOME:   Prior to Admission medications   Not on File  VITAL SIGNS:  Blood pressure 118/82, pulse 96, temperature 97.3 F (36.3 C), temperature source Oral, resp. rate 16, height 5\' 10"  (1.778 m), weight 98.9 kg (218 lb), SpO2 98 %.  PHYSICAL EXAMINATION:   Physical Exam  GENERAL:  51 y.o.-year-old patient lying in the bed with no acute distress.  EYES: Pupils equal, round, reactive to light and accommodation. No scleral  icterus. Extraocular muscles intact.  HEENT: Head atraumatic, normocephalic. Oropharynx and nasopharynx clear.  NECK:  Supple, no jugular venous distention. No thyroid enlargement, no tenderness.  LUNGS: coarse breath sounds bilaterally, minimal scattered wheezing, no rales,rhonchi or crepitation. No use of accessory muscles of respiration.  CARDIOVASCULAR: S1, S2 normal. No murmurs, rubs, or gallops.  ABDOMEN: Soft, nontender, nondistended. Bowel sounds present. No organomegaly or mass.  EXTREMITIES: No pedal edema, cyanosis, or clubbing.  NEUROLOGIC: Cranial nerves II through XII are intact. Muscle strength 5/5 in all extremities. Sensation intact. Gait not checked.  PSYCHIATRIC: The patient is alert and oriented x 3.  SKIN: No obvious rash, lesion, or ulcer.   LABORATORY PANEL:   CBC  Recent Labs Lab 12/24/16 0153  WBC 18.1*  HGB 13.5  HCT 39.6*  PLT 308   ------------------------------------------------------------------------------------------------------------------  Chemistries   Recent Labs Lab 12/24/16 0153  NA 137  K 4.6  CL 103  CO2 27  GLUCOSE 107*  BUN 9  CREATININE 0.81  CALCIUM 8.7*   ------------------------------------------------------------------------------------------------------------------  Cardiac Enzymes  Recent Labs Lab 12/24/16 0153  TROPONINI <0.03   ------------------------------------------------------------------------------------------------------------------  RADIOLOGY:  Dg Chest 2 View  Result Date: 12/24/2016 CLINICAL DATA:  Congestion for 4 days, shortness of breath for 1 hour. Intermittent fever. EXAM: CHEST  2 VIEW COMPARISON:  Chest radiograph January 25, 2016 FINDINGS: Cardiomediastinal silhouette is normal. No pleural effusions or focal consolidations. Bronchitic changes. Trachea projects midline and there is no pneumothorax. Soft tissue planes and included osseous structures are non-suspicious. IMPRESSION: Bronchitic changes  without focal consolidation. Electronically Signed   By: Awilda Metroourtnay  Bloomer M.D.   On: 12/24/2016 02:24    EKG:   Orders placed or performed during the hospital encounter of 12/24/16  . ED EKG  . ED EKG  . EKG 12-Lead  . EKG 12-Lead  . EKG 12-Lead  . EKG 12-Lead    IMPRESSION AND PLAN:   Richard Buchanan  is a 51 y.o. male with No significant past medical history other than smoking, presents to hospital secondary to fevers, Buchanan, worsening shortness of breath and chest pain going on for almost 4-5 days now.  #1 acute hypoxic respiratory failure-secondary to influenza illness and acute bronchitis. -Continue oxygen support as needed. -Started on Tamiflu. Also azithromycin for his bronchitis. -DuoNeb's as needed. -On droplet precautions  #2 Tobacco use disorder-started on nicotine patch. Counseled against smoking.  #3 leukocytosis-secondary to his bronchitis and influenza illness.  #4 DVT prophylaxis-on Lovenox    All the records are reviewed and case discussed with ED provider. Management plans discussed with the patient, family and they are in agreement.  CODE STATUS: Full Code  TOTAL TIME TAKING CARE OF THIS PATIENT: 50 minutes.    Enid BaasKALISETTI,Taner Rzepka M.D on 12/24/2016 at 10:45 AM  Between 7am to 6pm - Pager - [phone]  After 6pm go to [email or web address] - Social research officer, governmentpassword EPAS [facility]  [facility]  Office  [phone]  CC: Primary care physician; No PCP Per Patient

## 2016-12-24 NOTE — Progress Notes (Signed)
Pt was educated on the importance of urinating in the urinal.

## 2016-12-24 NOTE — ED Notes (Signed)
Pt placed on 2L [place] due to sats in the upper 80's on RA.Marland Kitchen. Pt now maintaining sats 95-98% on 2L..Marland Kitchen

## 2016-12-25 LAB — BASIC METABOLIC PANEL
ANION GAP: 7 (ref 5–15)
BUN: 16 mg/dL (ref 6–20)
CHLORIDE: 112 mmol/L — AB (ref 101–111)
CO2: 22 mmol/L (ref 22–32)
Calcium: 8.7 mg/dL — ABNORMAL LOW (ref 8.9–10.3)
Creatinine, Ser: 0.84 mg/dL (ref 0.61–1.24)
Glucose, Bld: 118 mg/dL — ABNORMAL HIGH (ref 65–99)
POTASSIUM: 4.9 mmol/L (ref 3.5–5.1)
SODIUM: 141 mmol/L (ref 135–145)

## 2016-12-25 LAB — CBC
HEMATOCRIT: 38.3 % — AB (ref 40.0–52.0)
HEMOGLOBIN: 12.7 g/dL — AB (ref 13.0–18.0)
MCH: 30.5 pg (ref 26.0–34.0)
MCHC: 33.3 g/dL (ref 32.0–36.0)
MCV: 91.8 fL (ref 80.0–100.0)
Platelets: 286 10*3/uL (ref 150–440)
RBC: 4.17 MIL/uL — AB (ref 4.40–5.90)
RDW: 12.7 % (ref 11.5–14.5)
WBC: 22.2 10*3/uL — AB (ref 3.8–10.6)

## 2016-12-25 MED ORDER — OSELTAMIVIR PHOSPHATE 75 MG PO CAPS: 75.0000 mg | ORAL_CAPSULE | Freq: Two times a day (BID) | ORAL | 0 refills | Status: DC

## 2016-12-25 MED ORDER — MOMETASONE FURO-FORMOTEROL FUM 100-5 MCG/ACT IN AERO: 2.0000 | INHALATION_SPRAY | Freq: Two times a day (BID) | RESPIRATORY_TRACT | 0 refills | Status: DC

## 2016-12-25 MED ORDER — MOMETASONE FURO-FORMOTEROL FUM 100-5 MCG/ACT IN AERO
2.0000 | INHALATION_SPRAY | Freq: Two times a day (BID) | RESPIRATORY_TRACT | Status: DC
  Filled 2016-12-25: qty 8.8

## 2016-12-25 MED ORDER — IPRATROPIUM-ALBUTEROL 0.5-2.5 (3) MG/3ML IN SOLN: 3.0000 mL | Freq: Two times a day (BID) | RESPIRATORY_TRACT | Status: DC

## 2016-12-25 MED ORDER — AZITHROMYCIN 250 MG PO TABS: 250.0000 mg | ORAL_TABLET | Freq: Every day | ORAL | 0 refills | Status: DC

## 2016-12-25 NOTE — Clinical Social Work Note (Signed)
Clinical Social Work Assessment  Patient Details  Name: Richard SousaJeffery L Buchanan MRN: [id number] Date of Birth: Aug 23, 1966  Date of referral:  12/25/16               Reason for consult:  Housing Concerns/Homelessness                Permission sought to share information with:    Permission granted to share information::     Name::        Agency::     Relationship::     Contact Information:     Housing/Transportation Living arrangements for the past 2 months:  Apartment Source of Information:  Patient Patient Interpreter Needed:  None Criminal Activity/Legal Involvement Pertinent to Current Situation/Hospitalization:  No - Comment as needed Significant Relationships:  Friend Lives with:  Significant Other Do you feel safe going back to the place where you live?  Yes Need for family participation in patient care:  No (Coment)  Care giving concerns:  Patient is independent in all ADL's.   Social Worker assessment / plan:  Patient discharging today and is going to have friends pick him up and stay with friends temporarily if no other arrangements are found. He is trying to see if he can go back to stay with his girlfriend as well. Patient inquired about homeless shelters in the area. [place]. RN CM assisting with discharge medications.  Employment status:  Unemployed Health and safety inspectornsurance information:  Self Pay (Medicaid Pending) PT Recommendations:    Information / Referral to community resources:  Shelter  Patient/Family's Response to care:  Patient expressed appreciation for CSW visit.  Patient/Family's Understanding of and Emotional Response to Diagnosis, Current Treatment, and Prognosis:  Patient hopeful about his future and working things out with his girlfriend.  Emotional Assessment Appearance:  Appears stated age Attitude/Demeanor/Rapport:   (pleasant and grateful) Affect (typically observed):  Adaptable, Calm Orientation:  Oriented to Self,  Oriented to Place, Oriented to  Time, Oriented to Situation Alcohol / Substance use:  Not Applicable Psych involvement (Current and /or in the community):  No (Comment)  Discharge Needs  Concerns to be addressed:  Homelessness Readmission within the last 30 days:  No Current discharge risk:  None Barriers to Discharge:  No Barriers Identified   York SpanielMonica Ayo Guarino, LCSW 12/25/2016, 11:53 AM

## 2016-12-25 NOTE — Care Management (Signed)
Patient admitted for acute respiratory failure secondary to flu.  Patient is listed as self pay.  [facility]  Patient to discharge on Dulera, tamiflu, and Zithromax.  Patient to be discharge with inhaler used while in hospital.  Patient provided with goodrx.com for zithromax out pocket cost. Out of pocket cost for tamiflu $50 with coupon.  Patient states that he will pick up zithromax, however will not pick up tamiflu.  Patient states that he will have a friend to pick him up after work today at 4 pm. Patient expressed desire for resources of homeless shelters. To be provided by CSW.  RNCM signing off.

## 2016-12-25 NOTE — Progress Notes (Signed)
Pt to be discharged per MD order. IV removed. Scripts given to pt, [facility]. CSW consulted and cleared pt for discharge. Instructions reviewed with pt and all questions answered.

## 2016-12-25 NOTE — Discharge Summary (Signed)
[employer] [facility] - [place] at [facility]   PATIENT NAME: Richard Buchanan    MR#:  [id number]  DATE OF BIRTH:  1966/01/19  DATE OF ADMISSION:  12/24/2016   ADMITTING PHYSICIAN: Enid Baas, MD  DATE OF DISCHARGE: 12/25/16  PRIMARY CARE PHYSICIAN: No PCP Per Patient   ADMISSION DIAGNOSIS:   Bronchitis [J40] Hypoxia [R09.02] Influenza [J11.1]  DISCHARGE DIAGNOSIS:   Active Problems:   Influenza-like illness   SECONDARY DIAGNOSIS:   Past Medical History:  Diagnosis Date  . Tobacco use disorder     HOSPITAL COURSE:   Richard Buchanan  is a 51 y.o. male with No significant past medical history other than smoking, presents to hospital secondary to fevers, chills, worsening shortness of breath and chest pain going on for almost 4-5 days now.  #1 acute hypoxic respiratory failure-secondary to influenza illness and acute bronchitis. -Required oxygen on admission. Now weaned off and on room air -on Tamiflu. Also azithromycin for his bronchitis. -DuoNeb's as needed. Being discharged on Dulera inhaler  #2 Tobacco use disorder-on nicotine patch while in the hospital. Counseled against smoking.  #3 leukocytosis-secondary to his bronchitis and influenza illness Monitor as outpatient   Feels great. Discharge today.  DISCHARGE CONDITIONS:   Guarded  CONSULTS OBTAINED:   None  DRUG ALLERGIES:   Allergies  Allergen Reactions  . Penicillins Rash and Other (See Comments)    Reaction: Hypotension Has patient had a PCN reaction causing immediate rash, facial/tongue/throat swelling, SOB or lightheadedness with hypotension: Yes Has patient had a PCN reaction causing severe rash involving mucus membranes or skin necrosis: No Has patient had a PCN reaction that required hospitalization No Has patient had a PCN reaction occurring within the last 10 years: Yes If all of the above answers are "NO", then may proceed with Cephalosporin use.    DISCHARGE  MEDICATIONS:   Allergies as of 12/25/2016      Reactions   Penicillins Rash, Other (See Comments)   Reaction: Hypotension Has patient had a PCN reaction causing immediate rash, facial/tongue/throat swelling, SOB or lightheadedness with hypotension: Yes Has patient had a PCN reaction causing severe rash involving mucus membranes or skin necrosis: No Has patient had a PCN reaction that required hospitalization No Has patient had a PCN reaction occurring within the last 10 years: Yes If all of the above answers are "NO", then may proceed with Cephalosporin use.      Medication List    TAKE these medications   azithromycin 250 MG tablet Commonly known as:  ZITHROMAX Take 1 tablet (250 mg total) by mouth daily. X 4 more days   mometasone-formoterol 100-5 MCG/ACT Aero Commonly known as:  DULERA Inhale 2 puffs into the lungs 2 (two) times daily.   oseltamivir 75 MG capsule Commonly known as:  TAMIFLU Take 1 capsule (75 mg total) by mouth 2 (two) times daily. X 4 more days        DISCHARGE INSTRUCTIONS:   1. PCP follow-up in 1-2 weeks  DIET:   Cardiac diet  ACTIVITY:   Activity as tolerated  OXYGEN:   Home Oxygen: No.  Oxygen Delivery: room air  DISCHARGE LOCATION:   home   If you experience worsening of your admission symptoms, develop shortness of breath, life threatening emergency, suicidal or homicidal thoughts you must seek medical attention immediately by calling 911 or calling your MD immediately  if symptoms less severe.  You Must read complete instructions/literature along with all the possible adverse reactions/side effects for  all the Medicines you take and that have been prescribed to you. Take any new Medicines after you have completely understood and accpet all the possible adverse reactions/side effects.   Please note  You were cared for by a hospitalist during your hospital stay. If you have any questions about your discharge medications or the care  you received while you were in the hospital after you are discharged, you can call the unit and asked to speak with the hospitalist on call if the hospitalist that took care of you is not available. Once you are discharged, your primary care physician will handle any further medical issues. Please note that NO REFILLS for any discharge medications will be authorized once you are discharged, as it is imperative that you return to your primary care physician (or establish a relationship with a primary care physician if you do not have one) for your aftercare needs so that they can reassess your need for medications and monitor your lab values.    On the day of Discharge:  VITAL SIGNS:   Blood pressure (!) 131/98, pulse 92, temperature 97.8 F (36.6 C), temperature source Oral, resp. rate 12, height 5\' 10"  (1.778 m), weight 98.9 kg (218 lb), SpO2 99 %.  PHYSICAL EXAMINATION:   GENERAL:  51 y.o.-year-old patient lying in the bed with no acute distress.  EYES: Pupils equal, round, reactive to light and accommodation. No scleral icterus. Extraocular muscles intact.  HEENT: Head atraumatic, normocephalic. Oropharynx and nasopharynx clear.  NECK:  Supple, no jugular venous distention. No thyroid enlargement, no tenderness.  LUNGS: normal breath sounds bilaterally, minimal scattered wheezing, no rales,rhonchi or crepitation. No use of accessory muscles of respiration.  CARDIOVASCULAR: S1, S2 normal. No murmurs, rubs, or gallops.  ABDOMEN: Soft, nontender, nondistended. Bowel sounds present. No organomegaly or mass.  EXTREMITIES: No pedal edema, cyanosis, or clubbing.  NEUROLOGIC: Cranial nerves II through XII are intact. Muscle strength 5/5 in all extremities. Sensation intact. Gait not checked.  PSYCHIATRIC: The patient is alert and oriented x 3.  SKIN: No obvious rash, lesion, or ulcer.   DATA REVIEW:   CBC  Recent Labs Lab 12/25/16 0631  WBC 22.2*  HGB 12.7*  HCT 38.3*  PLT 286     Chemistries   Recent Labs Lab 12/25/16 0631  NA 141  K 4.9  CL 112*  CO2 22  GLUCOSE 118*  BUN 16  CREATININE 0.84  CALCIUM 8.7*     Microbiology Results  Results for orders placed or performed during the hospital encounter of 12/24/16  Blood culture (routine x 2)     Status: None (Preliminary result)   Collection Time: 12/24/16  7:49 AM  Result Value Ref Range Status   Specimen Description BLOOD L HAND  Final   Special Requests BOTTLES DRAWN AEROBIC AND ANAEROBIC BCAV  Final   Culture NO GROWTH 1 DAY  Final   Report Status PENDING  Incomplete  Blood culture (routine x 2)     Status: None (Preliminary result)   Collection Time: 12/24/16  7:49 AM  Result Value Ref Range Status   Specimen Description BLOOD L AC  Final   Special Requests BOTTLES DRAWN AEROBIC AND ANAEROBIC BCAV  Final   Culture NO GROWTH 1 DAY  Final   Report Status PENDING  Incomplete  MRSA PCR Screening     Status: None   Collection Time: 12/24/16  9:25 AM  Result Value Ref Range Status   MRSA by PCR NEGATIVE NEGATIVE Final  Comment:        The GeneXpert MRSA Assay (FDA approved for NASAL specimens only), is one component of a comprehensive MRSA colonization surveillance program. It is not intended to diagnose MRSA infection nor to guide or monitor treatment for MRSA infections.     RADIOLOGY:  No results found.   Management plans discussed with the patient, family and they are in agreement.  CODE STATUS:     Code Status Orders        Start     Ordered   12/24/16 0920  Full code  Continuous     12/24/16 0919    Code Status History    Date Active Date Inactive Code Status Order ID Comments User Context   This patient has a current code status but no historical code status.      TOTAL TIME TAKING CARE OF THIS PATIENT: 37 minutes.    Enid Baas M.D on 12/25/2016 at 2:04 PM  Between 7am to 6pm - Pager - [phone]  After 6pm go to [email or web address] - Chartered loss adjuster [facility]  Office  [phone]  CC: Primary care physician; No PCP Per Patient   Note: This dictation was prepared with Dragon dictation along with smaller phrase technology. Any transcriptional errors that result from this process are unintentional.

## 2016-12-27 LAB — BLOOD GAS, VENOUS
Acid-Base Excess: 2.2 mmol/L — ABNORMAL HIGH (ref 0.0–2.0)
Bicarbonate: 27.8 mmol/L (ref 20.0–28.0)
PATIENT TEMPERATURE: 37
PH VEN: 7.39 (ref 7.250–7.430)
pCO2, Ven: 46 mmHg (ref 44.0–60.0)

## 2016-12-29 LAB — CULTURE, BLOOD (ROUTINE X 2)
CULTURE: NO GROWTH
Culture: NO GROWTH

## 2017-01-27 ENCOUNTER — Ambulatory Visit: Payer: Self-pay | Admitting: Pharmacy Technician

## 2017-01-28 NOTE — Progress Notes (Signed)
Patient scheduled for eligibility appointment at [facility].  Patient did not show for the appointment on 01/27/17 at 2pm.  Patient did not reschedule eligibility appointment.  [facility] unable to provide additional [facility] assistance until eligibility is determined.  Sherilyn DacostaBetty J. Angalena Cousineau Care Manager [facility]

## 2017-04-23 ENCOUNTER — Encounter: Payer: Self-pay | Admitting: *Deleted

## 2017-04-23 ENCOUNTER — Emergency Department
Admission: EM | Admit: 2017-04-23 | Discharge: 2017-04-23 | Disposition: A | Discharge status: Home / Self Care | Payer: Self-pay | Attending: Emergency Medicine | Admitting: Emergency Medicine

## 2017-04-23 DIAGNOSIS — F32A Depression, unspecified: Secondary | ICD-10-CM

## 2017-04-23 DIAGNOSIS — F329 Major depressive disorder, single episode, unspecified: Secondary | ICD-10-CM

## 2017-04-23 DIAGNOSIS — F17200 Nicotine dependence, unspecified, uncomplicated: Secondary | ICD-10-CM | POA: Insufficient documentation

## 2017-04-23 DIAGNOSIS — F1310 Sedative, hypnotic or anxiolytic abuse, uncomplicated: Secondary | ICD-10-CM

## 2017-04-23 DIAGNOSIS — F558 Abuse of other non-psychoactive substances: Secondary | ICD-10-CM | POA: Insufficient documentation

## 2017-04-23 DIAGNOSIS — Z8659 Personal history of other mental and behavioral disorders: Secondary | ICD-10-CM | POA: Insufficient documentation

## 2017-04-23 DIAGNOSIS — F4321 Adjustment disorder with depressed mood: Secondary | ICD-10-CM | POA: Insufficient documentation

## 2017-04-23 DIAGNOSIS — F4323 Adjustment disorder with mixed anxiety and depressed mood: Secondary | ICD-10-CM

## 2017-04-23 LAB — URINE DRUG SCREEN, QUALITATIVE (ARMC ONLY)
AMPHETAMINES, UR SCREEN: NOT DETECTED
Barbiturates, Ur Screen: NOT DETECTED
Benzodiazepine, Ur Scrn: POSITIVE — AB
CANNABINOID 50 NG, UR ~~LOC~~: NOT DETECTED
COCAINE METABOLITE, UR ~~LOC~~: NOT DETECTED
MDMA (ECSTASY) UR SCREEN: NOT DETECTED
Methadone Scn, Ur: NOT DETECTED
Opiate, Ur Screen: NOT DETECTED
PHENCYCLIDINE (PCP) UR S: NOT DETECTED
Tricyclic, Ur Screen: NOT DETECTED

## 2017-04-23 LAB — COMPREHENSIVE METABOLIC PANEL
ALBUMIN: 4.2 g/dL (ref 3.5–5.0)
ALT: 23 U/L (ref 17–63)
AST: 25 U/L (ref 15–41)
Alkaline Phosphatase: 70 U/L (ref 38–126)
Anion gap: 12 (ref 5–15)
BUN: 18 mg/dL (ref 6–20)
CHLORIDE: 97 mmol/L — AB (ref 101–111)
CO2: 24 mmol/L (ref 22–32)
CREATININE: 0.92 mg/dL (ref 0.61–1.24)
Calcium: 9.1 mg/dL (ref 8.9–10.3)
GFR calc Af Amer: >60 mL/min (ref >60)
GFR calc non Af Amer: >60 mL/min (ref >60)
GLUCOSE: 111 mg/dL — AB (ref 65–99)
Potassium: 3.5 mmol/L (ref 3.5–5.1)
SODIUM: 133 mmol/L — AB (ref 135–145)
Total Bilirubin: 0.9 mg/dL (ref 0.3–1.2)
Total Protein: 7.9 g/dL (ref 6.5–8.1)

## 2017-04-23 LAB — CBC
HCT: 41 % (ref 40.0–52.0)
HEMOGLOBIN: 14.4 g/dL (ref 13.0–18.0)
MCH: 30.9 pg (ref 26.0–34.0)
MCHC: 35.1 g/dL (ref 32.0–36.0)
MCV: 88.3 fL (ref 80.0–100.0)
Platelets: 430 10*3/uL (ref 150–440)
RBC: 4.65 MIL/uL (ref 4.40–5.90)
RDW: 12.9 % (ref 11.5–14.5)
WBC: 27.4 10*3/uL — ABNORMAL HIGH (ref 3.8–10.6)

## 2017-04-23 LAB — ETHANOL: Alcohol, Ethyl (B): <5 mg/dL (ref <5)

## 2017-04-23 LAB — SALICYLATE LEVEL: Salicylate Lvl: <7 mg/dL (ref 2.8–30.0)

## 2017-04-23 LAB — ACETAMINOPHEN LEVEL: Acetaminophen (Tylenol), Serum: <10 ug/mL — ABNORMAL LOW (ref 10–30)

## 2017-04-23 MED ORDER — CITALOPRAM HYDROBROMIDE 20 MG PO TABS: 20.0000 mg | ORAL_TABLET | Freq: Every day | ORAL | 1 refills | Status: AC

## 2017-04-23 NOTE — ED Notes (Signed)
Pt assessment charted by Doree FudgeKala Keen, R.N.

## 2017-04-23 NOTE — [facility] Counselor (Signed)
Dr. Toni Amendlapacs recommends D/C. Informed RN.  Wolfgang PhoenixBrandi Len Kluver, [facility] Triage Specialist

## 2017-04-23 NOTE — Consult Note (Signed)
[facility] Consult   Reason for Consult:  Consult for 51 year old man with history of substance abuse presents to the hospital with depressed mood Referring Physician:  Owens Shark Patient Identification: Richard Buchanan MRN:  [id number] Principal Diagnosis: Adjustment disorder with mixed anxiety and depressed mood Diagnosis:   Patient Active Problem List   Diagnosis Date Noted  . Benzodiazepine abuse [F13.10] 04/23/2017  . Adjustment disorder with mixed anxiety and depressed mood [F43.23] 04/23/2017  . Influenza-like illness [R69] 12/24/2016    Total Time spent with patient: 1 hour  Subjective:   Richard Buchanan is a 51 y.o. male patient admitted with "my depression has been worse".  HPI:  Patient interviewed chart reviewed. 51 year old man with a history of long-standing substance abuse and multiple major life issues presented to the hospital saying his depression was feeling worse. For the past couple months he has been feeling more down and hopeless. Patient spent quite a bit of time detailing his life stresses. He says he has been charged with multiple crimes which he adamantly denies having anything to do with but it's taking up all of his time and effort. His mother has cancer and has been sick. He is worrying all the time. Sleep is okay. Appetite okay. He is continuing to take Suboxone prescribed at [facility] and goes to substance abuse groups there but admits that he has been abusing benzodiazepines especially Xanax that he buys on the street. Denies any psychotic symptoms. Denies any thoughts of harming himself or anyone else.  Social history: Currently living with his ex-girlfriend. Mother has cancer. Patient has multiple legal charges which continue to be devil him and cost him a lot of time and effort.  Medical history: Denies knowing of any other medical problems other than his substance abuse. Takes Suboxone regularly.  Substance abuse history: History of alcohol abuse with  previous treatment here for detox. Says he hasn't used any alcohol at all in many months. He is on Suboxone to control his history of opiate abuse. He continues however to use it illegally purchased benzodiazepines in an abusive manner.  Past Psychiatric History: No history of suicide attempts. No history of violence. Only inpatient psychiatric was for detox and substance abuse treatment. He says he's been on Zoloft in the past which she didn't find particularly helpful but is interested in restarting antidepressant medicine.  Risk to Self: Is patient at risk for suicide?: No Risk to Others:   Prior Inpatient Therapy:   Prior Outpatient Therapy:    Past Medical History:  Past Medical History:  Diagnosis Date  . Tobacco use disorder     Past Surgical History:  Procedure Laterality Date  . ORIF RADIUS & ULNA FRACTURES Left   . SKIN GRAFT     right FA and right upper leg   Family History:  Family History  Problem Relation Age of Onset  . Lung cancer Mother   . Diabetes Father    Family Psychiatric  History: Denies any family history Social History:  History  Alcohol Use  . Yes     History  Drug Use    Social History   Social History  . Marital status: Legally Separated    Spouse name: N/A  . Number of children: N/A  . Years of education: N/A   Social History Main Topics  . Smoking status: Current Every Day Smoker    Packs/day: 2.00  . Smokeless tobacco: Former Systems developer  . Alcohol use Yes  . Drug use:  Yes  . Sexual activity: No   Other Topics Concern  . None   Social History Narrative   Currently staying with mother.   Additional Social History:    Allergies:   Allergies  Allergen Reactions  . Penicillins Rash and Other (See Comments)    Reaction: Hypotension Has patient had a PCN reaction causing immediate rash, facial/tongue/throat swelling, SOB or lightheadedness with hypotension: Yes Has patient had a PCN reaction causing severe rash involving mucus  membranes or skin necrosis: No Has patient had a PCN reaction that required hospitalization No Has patient had a PCN reaction occurring within the last 10 years: Yes If all of the above answers are "NO", then may proceed with Cephalosporin use.     Labs:  Results for orders placed or performed during the hospital encounter of 04/23/17 (from the past 48 hour(s))  Comprehensive metabolic panel     Status: Abnormal   Collection Time: 04/23/17 12:20 AM  Result Value Ref Range   Sodium 133 (L) 135 - 145 mmol/L   Potassium 3.5 3.5 - 5.1 mmol/L   Chloride 97 (L) 101 - 111 mmol/L   CO2 24 22 - 32 mmol/L   Glucose, Bld 111 (H) 65 - 99 mg/dL   BUN 18 6 - 20 mg/dL   Creatinine, Ser 0.92 0.61 - 1.24 mg/dL   Calcium 9.1 8.9 - 10.3 mg/dL   Total Protein 7.9 6.5 - 8.1 g/dL   Albumin 4.2 3.5 - 5.0 g/dL   AST 25 15 - 41 U/L   ALT 23 17 - 63 U/L   Alkaline Phosphatase 70 38 - 126 U/L   Total Bilirubin 0.9 0.3 - 1.2 mg/dL   GFR calc non Af Amer >60 >60 mL/min   GFR calc Af Amer >60 >60 mL/min    Comment: (NOTE) The eGFR has been calculated using the CKD EPI equation. This calculation has not been validated in all clinical situations. eGFR's persistently <60 mL/min signify possible Chronic Kidney Disease.    Anion gap 12 5 - 15  Ethanol     Status: None   Collection Time: 04/23/17 12:20 AM  Result Value Ref Range   Alcohol, Ethyl (B) <5 <5 mg/dL    Comment:        LOWEST DETECTABLE LIMIT FOR SERUM ALCOHOL IS 5 mg/dL FOR MEDICAL PURPOSES ONLY   Salicylate level     Status: None   Collection Time: 04/23/17 12:20 AM  Result Value Ref Range   Salicylate Lvl <2.9 2.8 - 30.0 mg/dL  Acetaminophen level     Status: Abnormal   Collection Time: 04/23/17 12:20 AM  Result Value Ref Range   Acetaminophen (Tylenol), Serum <10 (L) 10 - 30 ug/mL    Comment:        THERAPEUTIC CONCENTRATIONS VARY SIGNIFICANTLY. A RANGE OF 10-30 ug/mL MAY BE AN EFFECTIVE CONCENTRATION FOR MANY PATIENTS. HOWEVER,  SOME ARE BEST TREATED AT CONCENTRATIONS OUTSIDE THIS RANGE. ACETAMINOPHEN CONCENTRATIONS >150 ug/mL AT 4 HOURS AFTER INGESTION AND >50 ug/mL AT 12 HOURS AFTER INGESTION ARE OFTEN ASSOCIATED WITH TOXIC REACTIONS.   cbc     Status: Abnormal   Collection Time: 04/23/17 12:20 AM  Result Value Ref Range   WBC 27.4 (H) 3.8 - 10.6 K/uL   RBC 4.65 4.40 - 5.90 MIL/uL   Hemoglobin 14.4 13.0 - 18.0 g/dL   HCT 41.0 40.0 - 52.0 %   MCV 88.3 80.0 - 100.0 fL   MCH 30.9 26.0 - 34.0 pg  MCHC 35.1 32.0 - 36.0 g/dL   RDW 12.9 11.5 - 14.5 %   Platelets 430 150 - 440 K/uL  Urine Drug Screen, Qualitative     Status: Abnormal   Collection Time: 04/23/17 12:20 AM  Result Value Ref Range   Tricyclic, Ur Screen NONE DETECTED NONE DETECTED   Amphetamines, Ur Screen NONE DETECTED NONE DETECTED   MDMA (Ecstasy)Ur Screen NONE DETECTED NONE DETECTED   Cocaine Metabolite,Ur [place] NONE DETECTED NONE DETECTED   Opiate, Ur Screen NONE DETECTED NONE DETECTED   Phencyclidine (PCP) Ur S NONE DETECTED NONE DETECTED   Cannabinoid 50 Ng, Ur [place] NONE DETECTED NONE DETECTED   Barbiturates, Ur Screen NONE DETECTED NONE DETECTED   Benzodiazepine, Ur Scrn POSITIVE (A) NONE DETECTED   Methadone Scn, Ur NONE DETECTED NONE DETECTED    Comment: (NOTE) 250  Tricyclics, urine               Cutoff 1000 ng/mL 200  Amphetamines, urine             Cutoff 1000 ng/mL 300  MDMA (Ecstasy), urine           Cutoff 500 ng/mL 400  Cocaine Metabolite, urine       Cutoff 300 ng/mL 500  Opiate, urine                   Cutoff 300 ng/mL 600  Phencyclidine (PCP), urine      Cutoff 25 ng/mL 700  Cannabinoid, urine              Cutoff 50 ng/mL 800  Barbiturates, urine             Cutoff 200 ng/mL 900  Benzodiazepine, urine           Cutoff 200 ng/mL 1000 Methadone, urine                Cutoff 300 ng/mL 1100 1200 The urine drug screen provides only a preliminary, unconfirmed 1300 analytical test result and should not be used for  non-medical 1400 purposes. Clinical consideration and professional judgment should 1500 be applied to any positive drug screen result due to possible 1600 interfering substances. A more specific alternate chemical method 1700 must be used in order to obtain a confirmed analytical result.  1800 Gas chromato graphy / mass spectrometry (GC/MS) is the preferred 1900 confirmatory method.     No current facility-administered medications for this encounter.    Current Outpatient Prescriptions  Medication Sig Dispense Refill  . buprenorphine-naloxone (SUBOXONE) 8-2 mg SUBL SL tablet Place 0.5-1 tablets under the tongue 2 (two) times daily. Take 1 tablet every morning and 1/2 tablet every day at 4 pm.  0  . cloNIDine (CATAPRES) 0.1 MG tablet Take 0.1 mg by mouth 2 (two) times daily as needed.  2  . meloxicam (MOBIC) 15 MG tablet Take 15 mg by mouth daily.  2  . mometasone-formoterol (DULERA) 100-5 MCG/ACT AERO Inhale 2 puffs into the lungs 2 (two) times daily. (Patient not taking: Reported on 04/23/2017) 1 Inhaler 0    Musculoskeletal: Strength & Muscle Tone: within normal limits Gait & Station: normal Patient leans: N/A  Psychiatric Specialty Exam: Physical Exam  Nursing note and vitals reviewed. Constitutional: He appears well-developed and well-nourished.  HENT:  Head: Normocephalic and atraumatic.  Eyes: Conjunctivae are normal. Pupils are equal, round, and reactive to light.  Neck: Normal range of motion.  Cardiovascular: Regular rhythm and normal heart sounds.   Respiratory: Effort  normal.  GI: Soft.  Musculoskeletal: Normal range of motion.  Neurological: He is alert.  Skin: Skin is warm and dry.  Psychiatric: He has a normal mood and affect. His speech is normal and behavior is normal. Judgment and thought content normal. Cognition and memory are normal.    Review of Systems  Constitutional: Negative.   HENT: Negative.   Eyes: Negative.   Respiratory: Negative.    Cardiovascular: Negative.   Gastrointestinal: Negative.   Musculoskeletal: Negative.   Skin: Negative.   Neurological: Negative.   Psychiatric/Behavioral: Positive for depression and substance abuse. Negative for hallucinations, memory loss and suicidal ideas. The patient is nervous/anxious. The patient does not have insomnia.     Blood pressure 134/79, pulse 88, temperature 98.5 F (36.9 C), temperature source Oral, resp. rate 18, height 5' 10"  (1.778 m), weight 103 kg (227 lb), SpO2 99 %.Body mass index is 32.57 kg/m.  General Appearance: Casual  Eye Contact:  Good  Speech:  Clear and Coherent  Volume:  Normal  Mood:  Dysphoric  Affect:  Congruent  Thought Process:  Goal Directed  Orientation:  Full (Time, Place, and Person)  Thought Content:  Logical  Suicidal Thoughts:  No  Homicidal Thoughts:  No  Memory:  Immediate;   Good Recent;   Fair Remote;   Fair  Judgement:  Fair  Insight:  Fair  Psychomotor Activity:  Normal  Concentration:  Concentration: Fair  Recall:  [employer] of Knowledge:  Fair  Language:  Fair  Akathisia:  No  Handed:  Right  AIMS (if indicated):     Assets:  Communication Skills Desire for Improvement Housing Physical Health Resilience Social Support  ADL's:  Intact  Cognition:  WNL  Sleep:        Treatment Plan Summary: Medication management and Plan 51 year old man with a history of substance abuse and stress-related mood symptoms nevertheless would like to try antidepressants again. We discussed the possible utility of Celexa. I will give him a prescription for Celexa 20 mg a day. Side effects discussed. Patient is to follow-up with outpatient treatment at [facility] where he already goes. He does not need inpatient hospitalization. He is strongly encouraged to talk about his benzodiazepine abuse at [facility] and get treatment for that as well. Case reviewed with TTS and emergency room physician  Disposition: Patient does not meet criteria for  psychiatric inpatient admission. Supportive therapy provided about ongoing stressors.  Alethia Berthold, MD 04/23/2017 2:27 PM

## 2017-04-23 NOTE — ED Notes (Signed)
Pt discharged to lobby. Pt was stable and appreciative at that time. All papers and prescriptions were given and valuables returned. Verbal understanding expressed. Denies SI/HI and A/VH. Pt given opportunity to express concerns and ask questions.  

## 2017-04-23 NOTE — ED Triage Notes (Signed)
Pt reports he is depressed and feels hopeless.  Hx of drug and etoh use.  Pt denies etoh use today.  Pt calm and cooperative.  Denies Si or HI

## 2017-04-23 NOTE — ED Notes (Signed)
Patient taking shower.

## 2017-04-23 NOTE — Discharge Instructions (Signed)
You have been seen in the emergency department for a  psychiatric concern. You have been evaluated both medically as well as psychiatrically. Please follow-up with your outpatient resources provided. Return to the emergency department for any worsening symptoms, or any thoughts of hurting yourself or anyone else so that we may attempt to help you. 

## 2017-04-23 NOTE — ED Notes (Signed)
Patient admitted to unit calm and cooperative.  Patient mood is depressed, however he denies SI/HI/AVH. Patient oriented to unit.  No distress noted.

## 2017-04-23 NOTE — ED Notes (Signed)
Lunch brought to patient 

## 2017-04-23 NOTE — ED Notes (Signed)
Dr. Toni Amendlapacs in room with patient

## 2017-04-23 NOTE — ED Notes (Signed)
Pt arrived with complaints of Depression. Expresses that he feels like he's having a "mental breakdown". Lives with ex-girlfriend and he feels like "she's driving him crazy". Stressed out abt mom having cancer and abt the fact that he has no transportation to meet community service requirements. Expresses having a Hx of alcoholism.

## 2017-04-23 NOTE — ED Notes (Addendum)
Patient easily aroused from sleep for breakfast. Pt calm and cooperative during interview and voices no complaints. Pt denies SI/HI and A/V hallucinations at this time. Pt reports worsening depression due to several stressors (mother stage 4 cancer, living situation, etc) Pt encouraged to voice concerns and ask questions. Remote and tv guide given to patient. Patient eating breakfast and watching tv in bed

## 2017-04-23 NOTE — ED Notes (Signed)
Pt dressed out in purple scrubs, with this tech, Amy RN, and [employer].  Pt belongings bagged, labeled and locked up.  1 pair sandals, pants, underwear, tshirt, hat, sunglasses, vape pen and fluid, lighter silver colored necklace with cross and cell phone.  Pt ambulated to bed 19H.  Labs and urine sent to lab

## 2017-04-23 NOTE — ED Provider Notes (Signed)
-----------------------------------------   3:02 PM on 04/23/2017 -----------------------------------------  The patient has been seen by psychiatry, they believe the patient is safe for discharge home from a psychiatric perspective.  Patient will be discharged home with outpatient follow-up.   Minna AntisPaduchowski, Lissie Hinesley, MD 04/23/17 [phone]

## 2017-04-23 NOTE — ED Provider Notes (Signed)
[facility] Emergency Department Provider Note   First MD Initiated Contact with Patient 04/23/17 0036     (approximate)  I have reviewed the triage vital signs and the nursing notes.   HISTORY  Chief Complaint Behavior Problem   HPI Richard Buchanan is a 51 y.o. male presents to the emergency department with feelings of "depression and hopelessness". Patient states that he's had a history of drug and EtOH use in the past but denies any recent usage. Patient states that he has multiple life stressors at present including his living situation. Patient states that secondary to his depression and inability to acquire transportation he has had difficulty performing this communicates service probation requirements. In addition the patient also states that his mother currently has stage IV lung cancer. Patient denies any suicidal or homicidal ideation.   Past Medical History:  Diagnosis Date  . Tobacco use disorder     Patient Active Problem List   Diagnosis Date Noted  . Influenza-like illness 12/24/2016    Past Surgical History:  Procedure Laterality Date  . ORIF RADIUS & ULNA FRACTURES Left   . SKIN GRAFT     right FA and right upper leg    Prior to Admission medications   Medication Sig Start Date End Date Taking? Authorizing Provider  azithromycin (ZITHROMAX) 250 MG tablet Take 1 tablet (250 mg total) by mouth daily. X 4 more days 12/25/16   Enid BaasKalisetti, Radhika, MD  mometasone-formoterol [facility](DULERA) 100-5 MCG/ACT AERO Inhale 2 puffs into the lungs 2 (two) times daily. 12/25/16   Enid BaasKalisetti, Radhika, MD  oseltamivir (TAMIFLU) 75 MG capsule Take 1 capsule (75 mg total) by mouth 2 (two) times daily. X 4 more days 12/25/16   Enid BaasKalisetti, Radhika, MD    Allergies Penicillins  Family History  Problem Relation Age of Onset  . Lung cancer Mother   . Diabetes Father     Social History Social History  Substance Use Topics  . Smoking status: Current Every Day  Smoker    Packs/day: 2.00  . Smokeless tobacco: Former NeurosurgeonUser  . Alcohol use Yes    Review of Systems Constitutional: No fever/chills Eyes: No visual changes. ENT: No sore throat. Cardiovascular: Denies chest pain. Respiratory: Denies shortness of breath. Gastrointestinal: No abdominal pain.  No nausea, no vomiting.  No diarrhea.  No constipation. Genitourinary: Negative for dysuria. Musculoskeletal: Negative for neck pain.  Negative for back pain. Integumentary: Negative for rash. Neurological: Negative for headaches, focal weakness or numbness. Psychiatric:Positive for depression  ____________________________________________   PHYSICAL EXAM:  VITAL SIGNS: ED Triage Vitals  Enc Vitals Group     BP 04/23/17 0024 (!) 146/97     Pulse Rate 04/23/17 0024 (!) 110     Resp 04/23/17 0024 20     Temp 04/23/17 0024 98.6 F (37 C)     Temp Source 04/23/17 0024 Oral     SpO2 04/23/17 0024 99 %     Weight 04/23/17 0020 103 kg (227 lb)     Height 04/23/17 0020 1.778 m (5\' 10" )     Head Circumference --      Peak Flow --      Pain Score --      Pain Loc --      Pain Edu? --      Excl. in GC? --     Constitutional: Alert and oriented. Well appearing and in no acute distress. Eyes: Conjunctivae are normal.  Head: Atraumatic. Mouth/Throat: Mucous membranes are  moist. Oropharynx non-erythematous. Neck: No stridor.  Cardiovascular: Normal rate, regular rhythm. Good peripheral circulation. Grossly normal heart sounds. Respiratory: Normal respiratory effort.  No retractions. Lungs CTAB. Gastrointestinal: Soft and nontender. No distention.  Musculoskeletal: No lower extremity tenderness nor edema. No gross deformities of extremities. Neurologic:  Normal speech and language. No gross focal neurologic deficits are appreciated.  Skin:  Skin is warm, dry and intact. No rash noted. Psychiatric:Depressed mood. Speech and behavior are  normal.  ____________________________________________   LABS (all labs ordered are listed, but only abnormal results are displayed)  Labs Reviewed  COMPREHENSIVE METABOLIC PANEL - Abnormal; Notable for the following:       Result Value   Sodium 133 (*)    Chloride 97 (*)    Glucose, Bld 111 (*)    All other components within normal limits  ACETAMINOPHEN LEVEL - Abnormal; Notable for the following:    Acetaminophen (Tylenol), Serum <10 (*)    All other components within normal limits  CBC - Abnormal; Notable for the following:    WBC 27.4 (*)    All other components within normal limits  URINE DRUG SCREEN, QUALITATIVE ([facility] ONLY) - Abnormal; Notable for the following:    Benzodiazepine, Ur Scrn POSITIVE (*)    All other components within normal limits  ETHANOL  SALICYLATE LEVEL     Procedures   ____________________________________________   INITIAL IMPRESSION / ASSESSMENT AND PLAN / ED COURSE  Pertinent labs & imaging results that were available during my care of the patient were reviewed by me and considered in my medical decision making (see chart for details).  51 year old male presenting with feelings of depression. Awaiting psychiatry consultation. Patient denies any suicidal or homicidal ideation.      ____________________________________________  FINAL CLINICAL IMPRESSION(S) / ED DIAGNOSES  Final diagnoses:  Depression, unspecified depression type     MEDICATIONS GIVEN DURING THIS VISIT:  Medications - No data to display   NEW OUTPATIENT MEDICATIONS STARTED DURING THIS VISIT:  New Prescriptions   No medications on file    Modified Medications   No medications on file    Discontinued Medications   No medications on file     Note:  This document was prepared using Dragon voice recognition software and may include unintentional dictation errors.    Darci Current, MD 04/23/17 [id number]

## 2017-10-23 IMAGING — CR DG CHEST 2V
2 series · 2 of 2 positions shown · non-contrast
Comparison: Chest radiograph January 25, 2016

CLINICAL DATA: Congestion for 4 days, shortness of breath for 1
hour. Intermittent fever.

EXAM:
CHEST  2 VIEW

[chest pa]
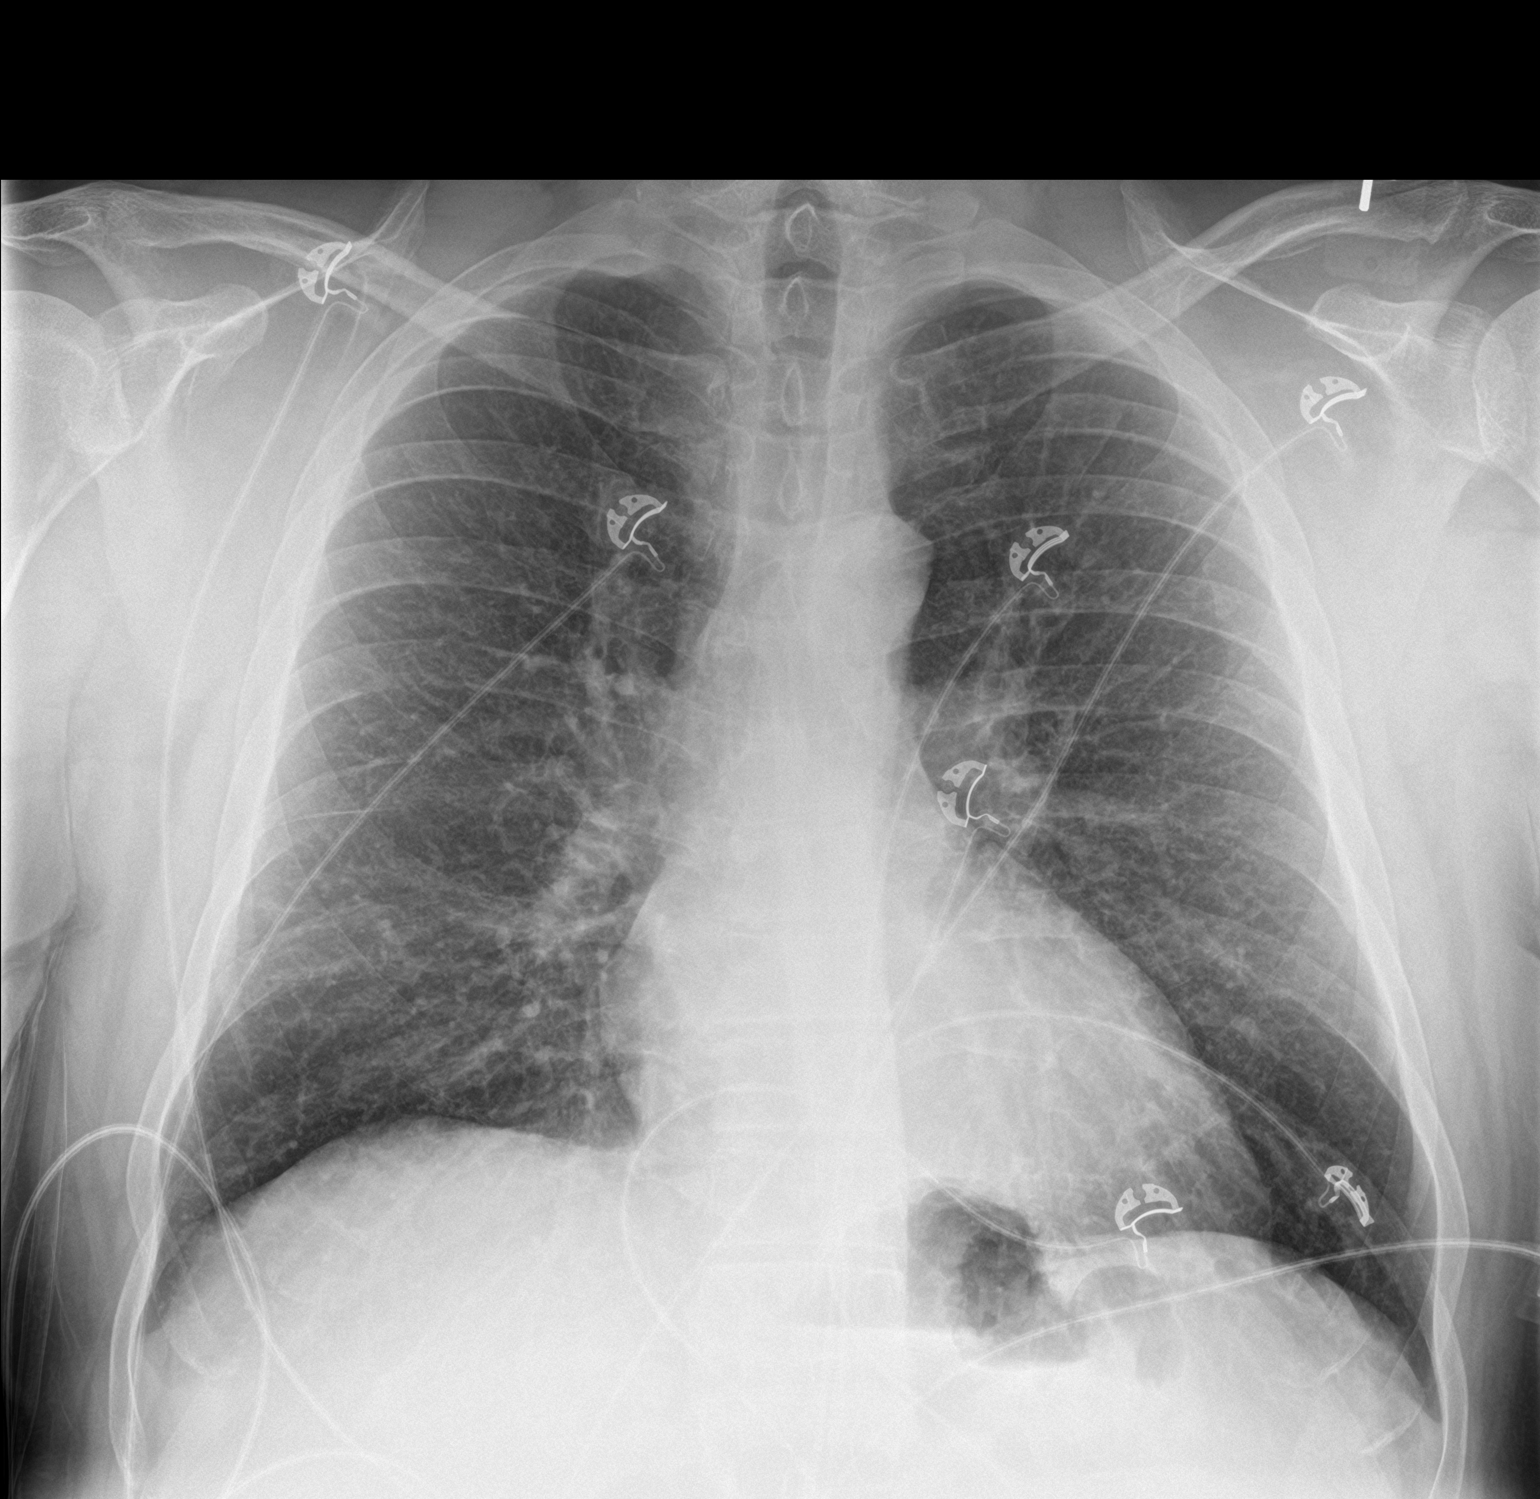

[chest lat]
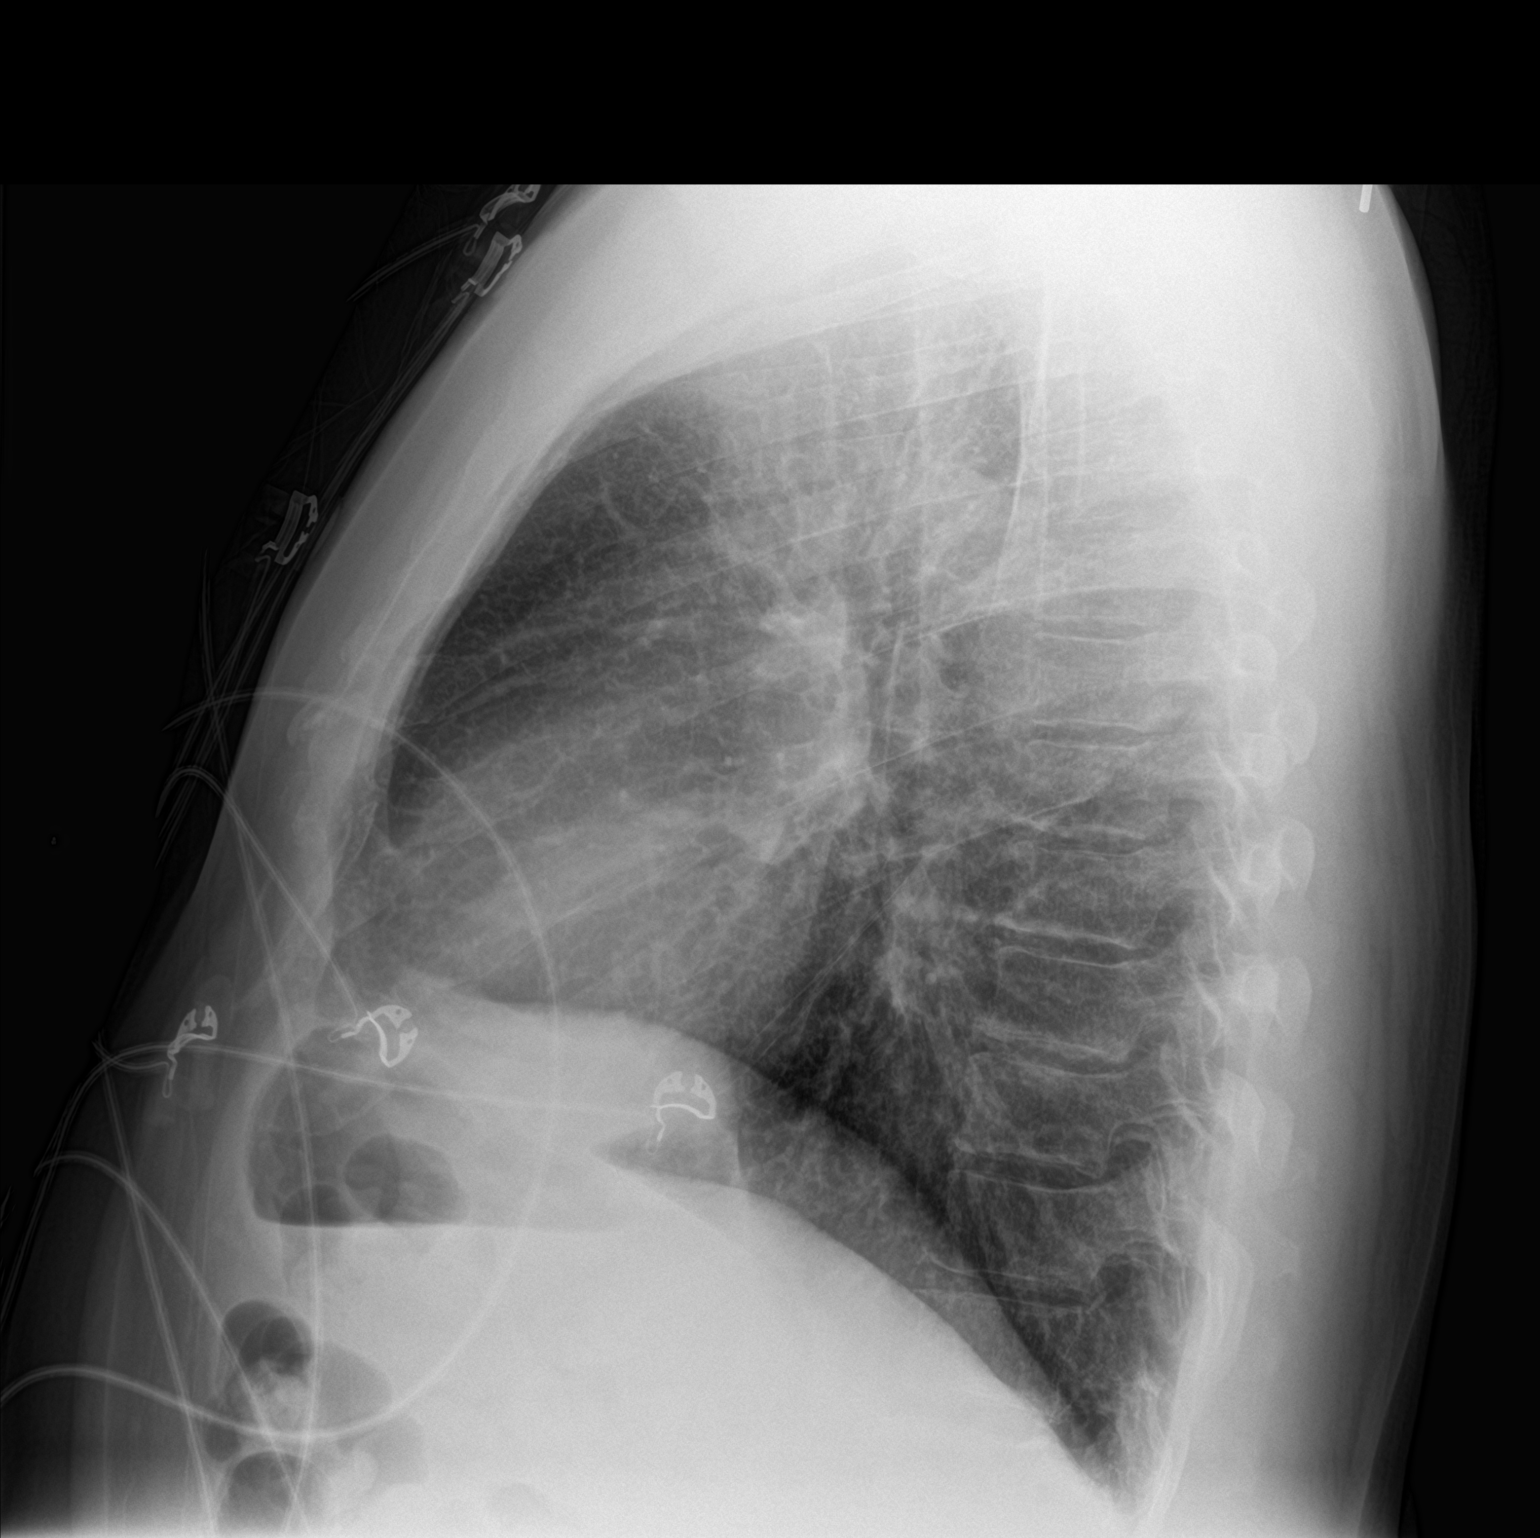

[2 of 2 positions shown; findings below may reference images not displayed]

FINDINGS: Cardiomediastinal silhouette is normal. No pleural effusions or
focal consolidations. Bronchitic changes. Trachea projects midline
and there is no pneumothorax. Soft tissue planes and included
osseous structures are non-suspicious.
IMPRESSION: Bronchitic changes without focal consolidation.

## 2018-12-21 ENCOUNTER — Encounter: Payer: Self-pay | Admitting: *Deleted

## 2020-10-19 ENCOUNTER — Other Ambulatory Visit: Payer: Self-pay

## 2020-10-19 ENCOUNTER — Emergency Department
Admission: EM | Admit: 2020-10-19 | Discharge: 2020-10-19 | Disposition: A | Discharge status: Home / Self Care | Payer: BC Managed Care – PPO | Attending: Emergency Medicine | Admitting: Emergency Medicine

## 2020-10-19 DIAGNOSIS — S51811A Laceration without foreign body of right forearm, initial encounter: Secondary | ICD-10-CM | POA: Insufficient documentation

## 2020-10-19 DIAGNOSIS — F17200 Nicotine dependence, unspecified, uncomplicated: Secondary | ICD-10-CM | POA: Diagnosis not present

## 2020-10-19 DIAGNOSIS — W01190A Fall on same level from slipping, tripping and stumbling with subsequent striking against furniture, initial encounter: Secondary | ICD-10-CM | POA: Insufficient documentation

## 2020-10-19 MED ORDER — BACITRACIN-NEOMYCIN-POLYMYXIN 400-5-5000 EX OINT
TOPICAL_OINTMENT | Freq: Once | CUTANEOUS | Status: AC
  Administered 2020-10-19: 1 via TOPICAL
  Filled 2020-10-19: qty 1

## 2020-10-19 MED ORDER — LIDOCAINE HCL (PF) 1 % IJ SOLN
10.0000 mL | Freq: Once | INTRAMUSCULAR | Status: AC
  Administered 2020-10-19: 10 mL
  Filled 2020-10-19: qty 10

## 2020-10-19 MED ORDER — TRAMADOL HCL 50 MG PO TABS: 50.0000 mg | ORAL_TABLET | Freq: Four times a day (QID) | ORAL | 0 refills | Status: DC | PRN

## 2020-10-19 MED ORDER — OXYCODONE-ACETAMINOPHEN 5-325 MG PO TABS
1.0000 | ORAL_TABLET | Freq: Once | ORAL | Status: AC
  Administered 2020-10-19: 1 via ORAL
  Filled 2020-10-19: qty 1

## 2020-10-19 NOTE — ED Provider Notes (Signed)
[facility] Emergency Department Provider Note   ____________________________________________   Event Date/Time   First MD Initiated Contact with Patient 10/19/20 [phone]     (approximate)  I have reviewed the triage vital signs and the nursing notes.   HISTORY  Chief Complaint Laceration    HPI Richard Buchanan is a 54 y.o. male patient presents with right forearm laceration secondary to a slip and fall.  Patient cut his forearm on the corner of a countertop.  Bleeding is controlled with direct pressure.  Patient has loss sensation of function.  Patient rates pain as a 5/10.  Patient described pain as "sore".  Pressure dressing applied in triage.  Patient is right-hand dominant.         Past Medical History:  Diagnosis Date  . Tobacco use disorder     Patient Active Problem List   Diagnosis Date Noted  . Benzodiazepine abuse (HCC) 04/23/2017  . Adjustment disorder with mixed anxiety and depressed mood 04/23/2017  . Influenza-like illness 12/24/2016    Past Surgical History:  Procedure Laterality Date  . ORIF RADIUS & ULNA FRACTURES Left   . SKIN GRAFT     right FA and right upper leg    Prior to Admission medications   Medication Sig Start Date End Date Taking? Authorizing Provider  buprenorphine-naloxone (SUBOXONE) 8-2 mg SUBL SL tablet Place 0.5-1 tablets under the tongue 2 (two) times daily. Take 1 tablet every morning and 1/2 tablet every day at 4 pm. 04/20/17   [provider]  citalopram (CELEXA) 20 MG tablet Take 1 tablet (20 mg total) by mouth daily. 04/23/17 04/23/18  Clapacs, Jackquline Denmark, MD  cloNIDine (CATAPRES) 0.1 MG tablet Take 0.1 mg by mouth 2 (two) times daily as needed. 03/28/17   [provider]  meloxicam (MOBIC) 15 MG tablet Take 15 mg by mouth daily. 03/29/17   [provider]  mometasone-formoterol (DULERA) 100-5 MCG/ACT AERO Inhale 2 puffs into the lungs 2 (two) times daily. Patient not taking: Reported  on 04/23/2017 12/25/16   Enid Baas, MD  traMADol (ULTRAM) 50 MG tablet Take 1 tablet (50 mg total) by mouth every 6 (six) hours as needed for moderate pain. 10/19/20   Joni Reining, PA-C    Allergies Penicillins  Family History  Problem Relation Age of Onset  . Lung cancer Mother   . Diabetes Father     Social History Social History   Tobacco Use  . Smoking status: Current Every Day Smoker    Packs/day: 2.00  . Smokeless tobacco: Former Engineer, water Use Topics  . Alcohol use: Yes  . Drug use: Yes    Review of Systems Constitutional: No fever/chills Eyes: No visual changes. ENT: No sore throat. Cardiovascular: Denies chest pain. Respiratory: Denies shortness of breath. Gastrointestinal: No abdominal pain.  No nausea, no vomiting.  No diarrhea.  No constipation. Genitourinary: Negative for dysuria. Musculoskeletal: Negative for back pain. Skin: Negative for rash. Neurological: Negative for headaches, focal weakness or numbness. Psychiatric:  Adjustment disorder and anxiety. Allergic/Immunilogical: Penicillin ____________________________________________   PHYSICAL EXAM:  VITAL SIGNS: ED Triage Vitals  Enc Vitals Group     BP 10/19/20 0643 (!) 146/101     Pulse Rate 10/19/20 0643 91     Resp 10/19/20 0643 20     Temp 10/19/20 0643 98 F (36.7 C)     Temp Source 10/19/20 0643 Oral     SpO2 10/19/20 0643 96 %  Weight 10/19/20 0644 212 lb (96.2 kg)     Height 10/19/20 0644 5\' 10"  (1.778 m)     Head Circumference --      Peak Flow --      Pain Score 10/19/20 0643 5     Pain Loc --      Pain Edu? --      Excl. in GC? --    Constitutional: Alert and oriented. Well appearing and in no acute distress. Cardiovascular: Normal rate, regular rhythm. Grossly normal heart sounds.  Good peripheral circulation.  Elevated blood pressure. Respiratory: Normal respiratory effort.  No retractions. Lungs CTAB. Skin: 12 cm laceration right forearm.  Psychiatric:  Mood and affect are normal. Speech and behavior are normal.  ____________________________________________   LABS (all labs ordered are listed, but only abnormal results are displayed)  Labs Reviewed - No data to display ____________________________________________  EKG   ____________________________________________  RADIOLOGY I, 14/09/21, personally viewed and evaluated these images (plain radiographs) as part of my medical decision making, as well as reviewing the written report by the radiologist.  ED MD interpretation:    Official radiology report(s): No results found.  ____________________________________________   PROCEDURES  Procedure(s) performed (including Critical Care):  Joni ReiningMarland KitchenLaceration Repair  Date/Time: 10/19/2020 9:27 AM Performed by: 14/07/2020, PA-C Authorized by: Joni Reining, PA-C   Consent:    Consent obtained:  Verbal   Consent given by:  Patient   Risks, benefits, and alternatives were discussed: yes     Risks discussed:  Infection, poor cosmetic result, need for additional repair and poor wound healing Universal protocol:    Procedure explained and questions answered to patient or proxy's satisfaction: yes     Required blood products, implants, devices, and special equipment available: no     Site/side marked: no     Immediately prior to procedure, a time out was called: yes     Patient identity confirmed:  Verbally with patient Anesthesia:    Anesthesia method:  Local infiltration   Local anesthetic:  Lidocaine 1% w/o epi Laceration details:    Location:  Shoulder/arm   Shoulder/arm location:  R lower arm   Length (cm):  12   Depth (mm):  3 Pre-procedure details:    Preparation:  Patient was prepped and draped in usual sterile fashion Exploration:    Contaminated: no   Treatment:    Area cleansed with:  Povidone-iodine and saline   Amount of cleaning:  Standard   Debridement:  None   Undermining:  None   Scar revision: no    Skin repair:    Repair method:  Sutures   Suture size:  3-0   Suture material:  Nylon   Suture technique:  Simple interrupted   Number of sutures:  16 Approximation:    Approximation:  Close Repair type:    Repair type:  Simple Post-procedure details:    Dressing:  Antibiotic ointment, non-adherent dressing and sterile dressing   Procedure completion:  Tolerated well, no immediate complications     ____________________________________________   INITIAL IMPRESSION / ASSESSMENT AND PLAN / ED COURSE  As part of my medical decision making, I reviewed the following data within the electronic MEDICAL RECORD NUMBER         Patient presents with laceration to right forearm.  See procedure note for wound closure.  Patient given discharge care instruction advised return back in 10 days to have suture removal.      ____________________________________________  FINAL CLINICAL IMPRESSION(S) / ED DIAGNOSES  Final diagnoses:  Forearm laceration, right, initial encounter     ED Discharge Orders         Ordered    traMADol (ULTRAM) 50 MG tablet  Every 6 hours PRN        10/19/20 0918          *Please note:  Richard Buchanan was evaluated in Emergency Department on 10/19/2020 for the symptoms described in the history of present illness. He was evaluated in the context of the global COVID-19 pandemic, which necessitated consideration that the patient might be at risk for infection with the SARS-CoV-2 virus that causes COVID-19. Institutional protocols and algorithms that pertain to the evaluation of patients at risk for COVID-19 are in a state of rapid change based on information released by regulatory bodies including the CDC and federal and state organizations. These policies and algorithms were followed during the patient's care in the ED.  Some ED evaluations and interventions may be delayed as a result of limited staffing during and the pandemic.*   Note:  This document was  prepared using Dragon voice recognition software and may include unintentional dictation errors.    Joni Reining, PA-C 10/19/20 5462    Dionne Bucy, MD 10/19/20 [phone]

## 2020-10-19 NOTE — Discharge Instructions (Signed)
Follow discharge care instruction take medication as directed. °

## 2020-10-19 NOTE — ED Notes (Signed)
E-signature not working at this time. Pt verbalized understanding of D/C instructions, prescriptions and follow up care with no further questions at this time. Pt in NAD and ambulatory at time of D/C.  

## 2020-10-19 NOTE — ED Triage Notes (Addendum)
Pt slipped on floor was wearing socks and has laceration to right forearm on counter top. Large lac noted to area with bleeding controlled. Pt has full rom to arm with normal circ and sensation to hand.

## 2020-10-19 NOTE — ED Notes (Signed)
Reference triage note. Pt noted with large laceration to right back forearm. Bleeding controlled at this time. Pt still has full sensation. Pt able to move affected extremity and below.

## 2020-11-03 ENCOUNTER — Other Ambulatory Visit: Payer: Self-pay

## 2020-11-03 ENCOUNTER — Ambulatory Visit
Admission: EM | Admit: 2020-11-03 | Discharge: 2020-11-03 | Disposition: A | Discharge status: Home / Self Care | Payer: BC Managed Care – PPO | Attending: Sports Medicine | Admitting: Sports Medicine

## 2020-11-03 ENCOUNTER — Encounter: Payer: Self-pay | Admitting: Emergency Medicine

## 2020-11-03 DIAGNOSIS — Z5189 Encounter for other specified aftercare: Secondary | ICD-10-CM

## 2020-11-03 DIAGNOSIS — Z4802 Encounter for removal of sutures: Secondary | ICD-10-CM

## 2020-11-03 NOTE — ED Triage Notes (Signed)
Patient states that he had sutures placed on his right forearm at [facility] ED on 10/19/20.  Patient states that he is here to have his sutures out.

## 2020-11-03 NOTE — Discharge Instructions (Addendum)
Please keep your wound clean and dry for 4 to 5 days.  Please continue to change your dressing.  Put the triple antibiotic over the wound then put the gauze on and then put a Tegaderm over it.  Once the wound closes over you can discontinue the use of the Tegaderm.

## 2020-11-05 NOTE — ED Provider Notes (Signed)
[facility]    CSN: [id number] Arrival date & time: 11/03/20  1121      History   Chief Complaint Chief Complaint  Patient presents with  . Suture / Staple Removal    Right forearm    HPI Richard Buchanan is a 54 y.o. male.   Richard Buchanan is a 54 y.o. male patient presents for suture removal of a right forearm laceration secondary to a slip and fall.  Patient cut his forearm on the corner of a countertop on 10/19/20.  Please see chart for full details  Was seen in the ER and received 16 sutures.  No complications noted.  Patient is right-hand dominant.     Past Medical History:  Diagnosis Date  . Tobacco use disorder     Patient Active Problem List   Diagnosis Date Noted  . Benzodiazepine abuse (HCC) 04/23/2017  . Adjustment disorder with mixed anxiety and depressed mood 04/23/2017  . Influenza-like illness 12/24/2016    Past Surgical History:  Procedure Laterality Date  . ORIF RADIUS & ULNA FRACTURES Left   . SKIN GRAFT     right FA and right upper leg       Home Medications    Prior to Admission medications   Medication Sig Start Date End Date Taking? Authorizing Provider  buprenorphine-naloxone (SUBOXONE) 8-2 mg SUBL SL tablet Place 0.5-1 tablets under the tongue 2 (two) times daily. Take 1 tablet every morning and 1/2 tablet every day at 4 pm. 04/20/17  Yes [provider]  cloNIDine (CATAPRES) 0.1 MG tablet Take 0.1 mg by mouth 2 (two) times daily as needed. 03/28/17  Yes [provider]  meloxicam (MOBIC) 15 MG tablet Take 15 mg by mouth daily. 03/29/17  Yes [provider]  citalopram (CELEXA) 20 MG tablet Take 1 tablet (20 mg total) by mouth daily. 04/23/17 04/23/18  Clapacs, Jackquline Denmark, MD  mometasone-formoterol (DULERA) 100-5 MCG/ACT AERO Inhale 2 puffs into the lungs 2 (two) times daily. Patient not taking: Reported on 04/23/2017 12/25/16   Enid Baas, MD  traMADol (ULTRAM) 50 MG tablet Take 1 tablet (50 mg total)  by mouth every 6 (six) hours as needed for moderate pain. 10/19/20   Joni Reining, PA-C    Family History Family History  Problem Relation Age of Onset  . Lung cancer Mother   . Diabetes Father     Social History Social History   Tobacco Use  . Smoking status: Current Every Day Smoker    Packs/day: 2.00  . Smokeless tobacco: Former Engineer, water Use Topics  . Alcohol use: Yes  . Drug use: Yes     Allergies   Penicillins   Review of Systems Review of Systems  Constitutional: Negative for chills, fatigue and fever.  Respiratory: Negative for shortness of breath.   Cardiovascular: Negative for chest pain.  Skin: Positive for wound. Negative for color change, pallor and rash.  Neurological: Positive for numbness. Negative for dizziness.  All other systems reviewed and are negative.    Physical Exam Triage Vital Signs ED Triage Vitals  Enc Vitals Group     BP 11/03/20 1205 121/81     Pulse Rate 11/03/20 1205 (!) 106     Resp 11/03/20 1205 16     Temp 11/03/20 1205 98.2 F (36.8 C)     Temp Source 11/03/20 1205 Oral     SpO2 11/03/20 1205 98 %     Weight 11/03/20 1203 212 lb (  96.2 kg)     Height 11/03/20 1203 5\' 11"  (1.803 m)     Head Circumference --      Peak Flow --      Pain Score 11/03/20 1203 0     Pain Loc --      Pain Edu? --      Excl. in GC? --    No data found.  Updated Vital Signs BP 121/81 (BP Location: Left Arm)   Pulse (!) 106   Temp 98.2 F (36.8 C) (Oral)   Resp 16   Ht 5\' 11"  (1.803 m)   Wt 96.2 kg   SpO2 98%   BMI 29.57 kg/m   Visual Acuity Right Eye Distance:   Left Eye Distance:   Bilateral Distance:    Right Eye Near:   Left Eye Near:    Bilateral Near:     Physical Exam Vitals and nursing note reviewed.  Constitutional:      General: He is not in acute distress.    Appearance: Normal appearance. He is not ill-appearing or toxic-appearing.  HENT:     Head: Normocephalic and atraumatic.  Musculoskeletal:      Comments: Tendon function wnl.  Skin:    General: Skin is warm.     Capillary Refill: Capillary refill takes less than 2 seconds.     Coloration: Skin is not jaundiced or pale.     Findings: Lesion present. No bruising, erythema or rash.     Comments: 12 cm laceration to the dorsal aspect of the right forearm.  No active infection.  Appropriate granulation tissue.  No discharge or abscess formation  Neurological:     General: No focal deficit present.     Mental Status: He is alert and oriented to person, place, and time.      UC Treatments / Results  Labs (all labs ordered are listed, but only abnormal results are displayed) Labs Reviewed - No data to display  EKG   Radiology No results found.  Procedures Procedures (including critical care time)  Medications Ordered in UC Medications - No data to display  Initial Impression / Assessment and Plan / UC Course  I have reviewed the triage vital signs and the nursing notes.  Pertinent labs & imaging results that were available during my care of the patient were reviewed by me and considered in my medical decision making (see chart for details).  Clinical Impression: 12 cm laceration to right forearm 15 days ago.  Here for suture removal. Doing well.    Treatment Plan:   1. The findings and treatment plan were discussed in detail with the patient.  The patient was in agreement and voiced verbal understanding. 2. Verbal consent obtained and 16 suture were removed without incidence. 3. Wound was dressed with gauze and tegaderm.  Advised to keep clean and dry and change dressing after showers under wound heals 4. Advised of potential complications and when to seek out medical attention.  Patient voiced verbal understanding. 5. Follow up as needed.  Final Clinical Impressions(s) / UC Diagnoses   Final diagnoses:  Encounter for removal of sutures  Visit for wound check     Discharge Instructions     Please keep your  wound clean and dry for 4 to 5 days.  Please continue to change your dressing.  Put the triple antibiotic over the wound then put the gauze on and then put a Tegaderm over it.  Once the wound closes over you can  discontinue the use of the Tegaderm.    ED Prescriptions    None     PDMP not reviewed this encounter.   Delton See, MD 11/06/20 [phone]

## 2021-12-13 ENCOUNTER — Ambulatory Visit
Admission: EM | Admit: 2021-12-13 | Discharge: 2021-12-13 | Disposition: A | Discharge status: Home / Self Care | Payer: 59

## 2021-12-13 ENCOUNTER — Other Ambulatory Visit: Payer: Self-pay

## 2021-12-13 DIAGNOSIS — J069 Acute upper respiratory infection, unspecified: Secondary | ICD-10-CM

## 2021-12-13 MED ORDER — DOXYCYCLINE HYCLATE 100 MG PO CAPS: 100.0000 mg | ORAL_CAPSULE | Freq: Two times a day (BID) | ORAL | 0 refills | Status: AC

## 2021-12-13 MED ORDER — BENZONATATE 100 MG PO CAPS: 200.0000 mg | ORAL_CAPSULE | Freq: Three times a day (TID) | ORAL | 0 refills | Status: AC

## 2021-12-13 MED ORDER — IPRATROPIUM BROMIDE 0.06 % NA SOLN: 2.0000 | Freq: Four times a day (QID) | NASAL | 12 refills | Status: AC

## 2021-12-13 MED ORDER — PROMETHAZINE-DM 6.25-15 MG/5ML PO SYRP: 5.0000 mL | ORAL_SOLUTION | Freq: Four times a day (QID) | ORAL | 0 refills | Status: AC | PRN

## 2021-12-13 NOTE — ED Triage Notes (Signed)
Pt here with C/O chest congestion, tired, nasal congestion for 5 days. Negative Covid at work on Monday. Needs DR note for Tuesday-Thursday.

## 2021-12-13 NOTE — ED Provider Notes (Signed)
[facility]    CSN: [id number] Arrival date & time: 12/13/21  1018      History   Chief Complaint Chief Complaint  Patient presents with   Cough   Sore Throat   Nasal Congestion    HPI Richard Buchanan is a 56 y.o. male.   HPI  89 old male here for evaluation of respiratory complaints.  Patient is a smoker and reports that he has been experiencing runny nose, nasal congestion, sore throat, fatigue, and a cough that is productive for clear sputum for the last 5 days.  He denies any fever, ear pain, shortness breath or wheezing, or GI complaints.  Past Medical History:  Diagnosis Date   Tobacco use disorder     Patient Active Problem List   Diagnosis Date Noted   Benzodiazepine abuse (HCC) 04/23/2017   Adjustment disorder with mixed anxiety and depressed mood 04/23/2017   Influenza-like illness 12/24/2016    Past Surgical History:  Procedure Laterality Date   ORIF RADIUS & ULNA FRACTURES Left    SKIN GRAFT     right FA and right upper leg       Home Medications    Prior to Admission medications   Medication Sig Start Date End Date Taking? Authorizing Provider  ALPRAZolam (XANAX XR) 1 MG 24 hr tablet Take 1 mg by mouth daily.   Yes [provider]  amphetamine-dextroamphetamine (ADDERALL) 30 MG tablet Take 30 mg by mouth 2 (two) times daily.   Yes [provider]  benzonatate (TESSALON) 100 MG capsule Take 2 capsules (200 mg total) by mouth every 8 (eight) hours. 12/13/21  Yes Becky Augustayan, Bijal Siglin, NP  buprenorphine-naloxone (SUBOXONE) 8-2 mg SUBL SL tablet Place 0.5-1 tablets under the tongue 2 (two) times daily. Take 1 tablet every morning and 1/2 tablet every day at 4 pm. 04/20/17  Yes [provider]  doxycycline (VIBRAMYCIN) 100 MG capsule Take 1 capsule (100 mg total) by mouth 2 (two) times daily. 12/13/21  Yes Becky Augustayan, Kima Malenfant, NP  ipratropium (ATROVENT) 0.06 % nasal spray Place 2 sprays into both nostrils 4 (four) times daily. 12/13/21   Yes Becky Augustayan, Niasia Lanphear, NP  promethazine-dextromethorphan (PROMETHAZINE-DM) 6.25-15 MG/5ML syrup Take 5 mLs by mouth 4 (four) times daily as needed. 12/13/21  Yes Becky Augustayan, Carlisle Enke, NP  citalopram (CELEXA) 20 MG tablet Take 1 tablet (20 mg total) by mouth daily. 04/23/17 04/23/18  Clapacs, Jackquline DenmarkJohn T, MD    Family History Family History  Problem Relation Age of Onset   Lung cancer Mother    Diabetes Father     Social History Social History   Tobacco Use   Smoking status: Every Day    Packs/day: 2.00    Types: Cigarettes   Smokeless tobacco: Former  Substance Use Topics   Alcohol use: Yes   Drug use: Yes     Allergies   Penicillins   Review of Systems Review of Systems  Constitutional:  Positive for fatigue. Negative for fever.  HENT:  Positive for congestion, rhinorrhea and sore throat. Negative for ear pain.   Respiratory:  Positive for cough. Negative for shortness of breath and wheezing.   Gastrointestinal:  Negative for diarrhea, nausea and vomiting.  Skin:  Negative for rash.  Hematological: Negative.   Psychiatric/Behavioral: Negative.      Physical Exam Triage Vital Signs ED Triage Vitals  Enc Vitals Group     BP 12/13/21 1047 (!) 145/90     Pulse Rate 12/13/21 1047 98  Resp 12/13/21 1047 18     Temp 12/13/21 1047 98.6 F (37 C)     Temp Source 12/13/21 1047 Oral     SpO2 12/13/21 1047 99 %     Weight 12/13/21 1044 214 lb (97.1 kg)     Height 12/13/21 1044 5\' 11"  (1.803 m)     Head Circumference --      Peak Flow --      Pain Score 12/13/21 1044 0     Pain Loc --      Pain Edu? --      Excl. in GC? --    No data found.  Updated Vital Signs BP (!) 145/90 (BP Location: Left Arm)    Pulse 98    Temp 98.6 F (37 C) (Oral)    Resp 18    Ht 5\' 11"  (1.803 m)    Wt 214 lb (97.1 kg)    SpO2 99%    BMI 29.85 kg/m   Visual Acuity Right Eye Distance:   Left Eye Distance:   Bilateral Distance:    Right Eye Near:   Left Eye Near:    Bilateral Near:     Physical  Exam Vitals and nursing note reviewed.  Constitutional:      General: He is not in acute distress.    Appearance: Normal appearance. He is not ill-appearing.  HENT:     Head: Normocephalic and atraumatic.     Right Ear: Tympanic membrane, ear canal and external ear normal. There is no impacted cerumen.     Left Ear: Tympanic membrane, ear canal and external ear normal. There is no impacted cerumen.     Nose: Congestion and rhinorrhea present.     Comments: Erythematous and edematous nasal mucosa with thick purulent discharge in both nares.    Mouth/Throat:     Mouth: Mucous membranes are moist.     Pharynx: Oropharynx is clear. Posterior oropharyngeal erythema present.  Cardiovascular:     Rate and Rhythm: Normal rate and regular rhythm.     Pulses: Normal pulses.     Heart sounds: Normal heart sounds. No murmur heard.   No friction rub. No gallop.  Pulmonary:     Effort: Pulmonary effort is normal.     Breath sounds: Normal breath sounds. No wheezing, rhonchi or rales.  Musculoskeletal:     Cervical back: Normal range of motion and neck supple.  Lymphadenopathy:     Cervical: No cervical adenopathy.  Skin:    General: Skin is warm and dry.     Capillary Refill: Capillary refill takes less than 2 seconds.     Findings: No erythema or rash.  Neurological:     General: No focal deficit present.     Mental Status: He is alert and oriented to person, place, and time.  Psychiatric:        Mood and Affect: Mood normal.        Behavior: Behavior normal.        Thought Content: Thought content normal.        Judgment: Judgment normal.     UC Treatments / Results  Labs (all labs ordered are listed, but only abnormal results are displayed) Labs Reviewed - No data to display  EKG   Radiology No results found.  Procedures Procedures (including critical care time)  Medications Ordered in UC Medications - No data to display  Initial Impression / Assessment and Plan / UC  Course  I have reviewed the  triage vital signs and the nursing notes.  Pertinent labs & imaging results that were available during my care of the patient were reviewed by me and considered in my medical decision making (see chart for details).  Patient is a nontoxic-appearing 56 year old male here for evaluation of respiratory complaints as outlined HPI above.  Patient is a smoker.  His physical exam reveals pearly-gray tympanic membranes bilaterally with normal light reflex and clear external auditory canals.  Nasal mucosa is erythematous and edematous with thick purulent discharge in both nares.  No frontal or maxillary sinus tenderness to percussion noted.  Patient does have mild erythema to the posterior oropharynx on exam but no exudate or injection.  No cervical lymphadenopathy appreciated exam.  Cardiopulmonary exam feels clung sounds in all fields.  Patient Damas consistent with an upper respiratory infection and I suspect that his postnasal drip is what is triggering his cough.  Given that he is a smoker I will do a short course of doxycycline as he is allergic to penicillin.  I will also give Atrovent nasal spray to the nasal congestion, Tessalon Perles help with cough, and Promethazine DM cough syrup to help with cough and congestion at bedtime.  Work note provided.   Final Clinical Impressions(s) / UC Diagnoses   Final diagnoses:  Upper respiratory tract infection, unspecified type     Discharge Instructions      Take the Doxycycline twice daily with food for 10 days for treatment of your URI.   Use the Atrovent nasal spray, 2 squirts in each nostril every 6 hours, as needed for runny nose and postnasal drip.  Use the Tessalon Perles every 8 hours during the day.  Take them with a small sip of water.  They may give you some numbness to the base of your tongue or a metallic taste in your mouth, this is normal.  Use the Promethazine DM cough syrup at bedtime for cough and  congestion.  It will make you drowsy so do not take it during the day.  Return for reevaluation or see your primary care provider for any new or worsening symptoms.      ED Prescriptions     Medication Sig Dispense Auth. Provider   doxycycline (VIBRAMYCIN) 100 MG capsule Take 1 capsule (100 mg total) by mouth 2 (two) times daily. 20 capsule Becky Augusta, NP   benzonatate (TESSALON) 100 MG capsule Take 2 capsules (200 mg total) by mouth every 8 (eight) hours. 21 capsule Becky Augusta, NP   ipratropium (ATROVENT) 0.06 % nasal spray Place 2 sprays into both nostrils 4 (four) times daily. 15 mL Becky Augusta, NP   promethazine-dextromethorphan (PROMETHAZINE-DM) 6.25-15 MG/5ML syrup Take 5 mLs by mouth 4 (four) times daily as needed. 118 mL Becky Augusta, NP      PDMP not reviewed this encounter.   Becky Augusta, NP 12/13/21 1212

## 2021-12-13 NOTE — Discharge Instructions (Signed)
Take the Doxycycline twice daily with food for 10 days for treatment of your URI.  ° °Use the Atrovent nasal spray, 2 squirts in each nostril every 6 hours, as needed for runny nose and postnasal drip. ° °Use the Tessalon Perles every 8 hours during the day.  Take them with a small sip of water.  They may give you some numbness to the base of your tongue or a metallic taste in your mouth, this is normal. ° °Use the Promethazine DM cough syrup at bedtime for cough and congestion.  It will make you drowsy so do not take it during the day. ° °Return for reevaluation or see your primary care provider for any new or worsening symptoms.  °

## 2022-02-26 ENCOUNTER — Encounter: Payer: Self-pay | Admitting: *Deleted

## 2022-02-26 ENCOUNTER — Other Ambulatory Visit: Payer: Self-pay

## 2022-02-26 DIAGNOSIS — Z5900 Homelessness unspecified: Secondary | ICD-10-CM | POA: Diagnosis not present

## 2022-02-26 DIAGNOSIS — R110 Nausea: Secondary | ICD-10-CM | POA: Diagnosis present

## 2022-02-26 DIAGNOSIS — K219 Gastro-esophageal reflux disease without esophagitis: Secondary | ICD-10-CM | POA: Insufficient documentation

## 2022-02-26 LAB — COMPREHENSIVE METABOLIC PANEL
ALT: 21 U/L (ref 0–44)
AST: 25 U/L (ref 15–41)
Albumin: 3.9 g/dL (ref 3.5–5.0)
Alkaline Phosphatase: 51 U/L (ref 38–126)
Anion gap: 8 (ref 5–15)
BUN: 15 mg/dL (ref 6–20)
CO2: 26 mmol/L (ref 22–32)
Calcium: 9 mg/dL (ref 8.9–10.3)
Chloride: 104 mmol/L (ref 98–111)
Creatinine, Ser: 0.81 mg/dL (ref 0.61–1.24)
GFR, Estimated: >60 mL/min (ref >60)
Glucose, Bld: 93 mg/dL (ref 70–99)
Potassium: 4.2 mmol/L (ref 3.5–5.1)
Sodium: 138 mmol/L (ref 135–145)
Total Bilirubin: 0.5 mg/dL (ref 0.3–1.2)
Total Protein: 7.3 g/dL (ref 6.5–8.1)

## 2022-02-26 LAB — CBC
HCT: 39.9 % (ref 39.0–52.0)
Hemoglobin: 13.5 g/dL (ref 13.0–17.0)
MCH: 30.3 pg (ref 26.0–34.0)
MCHC: 33.8 g/dL (ref 30.0–36.0)
MCV: 89.7 fL (ref 80.0–100.0)
Platelets: 363 10*3/uL (ref 150–400)
RBC: 4.45 MIL/uL (ref 4.22–5.81)
RDW: 12.6 % (ref 11.5–15.5)
WBC: 13.6 10*3/uL — ABNORMAL HIGH (ref 4.0–10.5)
nRBC: 0 % (ref 0.0–0.2)

## 2022-02-26 LAB — URINALYSIS, ROUTINE W REFLEX MICROSCOPIC
Bilirubin Urine: NEGATIVE
Glucose, UA: NEGATIVE mg/dL
Hgb urine dipstick: NEGATIVE
Ketones, ur: NEGATIVE mg/dL
Leukocytes,Ua: NEGATIVE
Nitrite: NEGATIVE
Protein, ur: NEGATIVE mg/dL
Specific Gravity, Urine: 1.027 (ref 1.005–1.030)
pH: 5 (ref 5.0–8.0)

## 2022-02-26 LAB — LIPASE, BLOOD: Lipase: 26 U/L (ref 11–51)

## 2022-02-26 NOTE — ED Triage Notes (Signed)
Pt brought in via ems with vomiting and diarrhea.  Sx began last night.  Vomited x 2 today. Pt states abd cramping.  Pt alert.  ?

## 2022-02-26 NOTE — ED Triage Notes (Signed)
EMS brings pt in from "side of road" for c/o N/V/D since last night ?

## 2022-02-27 ENCOUNTER — Emergency Department
Admission: EM | Admit: 2022-02-27 | Discharge: 2022-02-27 | Disposition: A | Discharge status: Home / Self Care | Payer: 59 | Attending: Emergency Medicine | Admitting: Emergency Medicine

## 2022-02-27 DIAGNOSIS — Z5900 Homelessness unspecified: Secondary | ICD-10-CM

## 2022-02-27 DIAGNOSIS — K219 Gastro-esophageal reflux disease without esophagitis: Secondary | ICD-10-CM

## 2022-02-27 DIAGNOSIS — R110 Nausea: Secondary | ICD-10-CM

## 2022-02-27 MED ORDER — LIDOCAINE VISCOUS HCL 2 % MT SOLN
15.0000 mL | Freq: Once | OROMUCOSAL | Status: AC
  Administered 2022-02-27: 15 mL via ORAL
  Filled 2022-02-27: qty 15

## 2022-02-27 MED ORDER — ONDANSETRON 4 MG PO TBDP
4.0000 mg | ORAL_TABLET | Freq: Once | ORAL | Status: AC
  Administered 2022-02-27: 4 mg via ORAL
  Filled 2022-02-27: qty 1

## 2022-02-27 MED ORDER — ONDANSETRON 4 MG PO TBDP: 4.0000 mg | ORAL_TABLET | Freq: Four times a day (QID) | ORAL | 0 refills | Status: AC | PRN

## 2022-02-27 MED ORDER — ALUM & MAG HYDROXIDE-SIMETH 200-200-20 MG/5ML PO SUSP
30.0000 mL | Freq: Once | ORAL | Status: AC
  Administered 2022-02-27: 30 mL via ORAL
  Filled 2022-02-27: qty 30

## 2022-02-27 MED ORDER — PANTOPRAZOLE SODIUM 40 MG PO TBEC: 40.0000 mg | DELAYED_RELEASE_TABLET | Freq: Every day | ORAL | 1 refills | Status: AC

## 2022-02-27 NOTE — ED Provider Notes (Signed)
? ?[facility] ?Provider Note ? ? ? Event Date/Time  ? First MD Initiated Contact with Patient 02/27/22 0325   ?  (approximate) ? ? ?History  ? ?Emesis and Diarrhea ? ? ?HPI ? ?Richard Buchanan is a 56 y.o. male with history of homelessness who presents to the emergency department with complaints of nausea and heartburn today.  He denies to me any vomiting despite nursing notes.  States he has chronic diarrhea which is unchanged.  No chest pain, shortness of breath, abdominal pain, urinary symptoms.  No previous abdominal surgeries. ? ? ?History provided by patient. ? ? ? ?Past Medical History:  ?Diagnosis Date  ? Tobacco use disorder   ? ? ?Past Surgical History:  ?Procedure Laterality Date  ? ORIF RADIUS & ULNA FRACTURES Left   ? SKIN GRAFT    ? right FA and right upper leg  ? ? ?MEDICATIONS:  ?Prior to Admission medications   ?Medication Sig Start Date End Date Taking? Authorizing Provider  ?ALPRAZolam (XANAX XR) 1 MG 24 hr tablet Take 1 mg by mouth daily.    [provider]  ?amphetamine-dextroamphetamine (ADDERALL) 30 MG tablet Take 30 mg by mouth 2 (two) times daily.    [provider]  ?benzonatate (TESSALON) 100 MG capsule Take 2 capsules (200 mg total) by mouth every 8 (eight) hours. 12/13/21   Becky Augusta, NP  ?buprenorphine-naloxone (SUBOXONE) 8-2 mg SUBL SL tablet Place 0.5-1 tablets under the tongue 2 (two) times daily. Take 1 tablet every morning and 1/2 tablet every day at 4 pm. 04/20/17   [provider]  ?citalopram (CELEXA) 20 MG tablet Take 1 tablet (20 mg total) by mouth daily. 04/23/17 04/23/18  Clapacs, Jackquline Denmark, MD  ?doxycycline (VIBRAMYCIN) 100 MG capsule Take 1 capsule (100 mg total) by mouth 2 (two) times daily. 12/13/21   Becky Augusta, NP  ?ipratropium (ATROVENT) 0.06 % nasal spray Place 2 sprays into both nostrils 4 (four) times daily. 12/13/21   Becky Augusta, NP  ?promethazine-dextromethorphan (PROMETHAZINE-DM) 6.25-15 MG/5ML syrup Take 5 mLs by mouth  4 (four) times daily as needed. 12/13/21   Becky Augusta, NP  ? ? ?Physical Exam  ? ?Triage Vital Signs: ?ED Triage Vitals  ?Enc Vitals Group  ?   BP 02/26/22 2310 126/78  ?   Pulse Rate 02/26/22 2310 83  ?   Resp 02/26/22 2310 20  ?   Temp 02/26/22 2310 98.8 ?F (37.1 ?C)  ?   Temp Source 02/26/22 2310 Oral  ?   SpO2 02/26/22 2255 97 %  ?   Weight 02/26/22 2307 221 lb (100.2 kg)  ?   Height 02/26/22 2307 5\' 10"  (1.778 m)  ?   Head Circumference --   ?   Peak Flow --   ?   Pain Score 02/26/22 2307 4  ?   Pain Loc --   ?   Pain Edu? --   ?   Excl. in GC? --   ? ? ?Most recent vital signs: ?Vitals:  ? 02/26/22 2255 02/26/22 2310  ?BP:  126/78  ?Pulse:  83  ?Resp:  20  ?Temp:  98.8 ?F (37.1 ?C)  ?SpO2: 97% 95%  ? ? ?CONSTITUTIONAL: Alert and oriented and responds appropriately to questions. Well-appearing; well-nourished ?HEAD: Normocephalic, atraumatic ?EYES: Conjunctivae clear, pupils appear equal, sclera nonicteric ?ENT: normal nose; moist mucous membranes ?NECK: Supple, normal ROM ?CARD: RRR; S1 and S2 appreciated; no murmurs, no clicks, no rubs, no gallops ?RESP: Normal  chest excursion without splinting or tachypnea; breath sounds clear and equal bilaterally; no wheezes, no rhonchi, no rales, no hypoxia or respiratory distress, speaking full sentences ?ABD/GI: Normal bowel sounds; non-distended; soft, non-tender, no rebound, no guarding, no peritoneal signs ?BACK: The back appears normal ?EXT: Normal ROM in all joints; no deformity noted, no edema; no cyanosis ?SKIN: Normal color for age and race; warm; no rash on exposed skin ?NEURO: Moves all extremities equally, normal speech ?PSYCH: The patient's mood and manner are appropriate. ? ? ?ED Results / Procedures / Treatments  ? ?LABS: ?(all labs ordered are listed, but only abnormal results are displayed) ?Labs Reviewed  ?CBC - Abnormal; Notable for the following components:  ?    Result Value  ? WBC 13.6 (*)   ? All other components within normal limits  ?URINALYSIS,  ROUTINE W REFLEX MICROSCOPIC - Abnormal; Notable for the following components:  ? Color, Urine YELLOW (*)   ? APPearance HAZY (*)   ? All other components within normal limits  ?LIPASE, BLOOD  ?COMPREHENSIVE METABOLIC PANEL  ? ? ? ?EKG: ? ? ?RADIOLOGY: ?My personal review and interpretation of imaging:   ? ?I have personally reviewed all radiology reports.   ?No results found. ? ? ?PROCEDURES: ? ?Critical Care performed: No ? ? ? ? ?Procedures ? ? ? ?IMPRESSION / MDM / ASSESSMENT AND PLAN / ED COURSE  ?I reviewed the triage vital signs and the nursing notes. ? ? ? ?Patient here with nausea and heartburn.  No abdominal pain, chest pain. ? ?The patient is on the cardiac monitor to evaluate for evidence of arrhythmia and/or significant heart rate changes. ? ? ?DIFFERENTIAL DIAGNOSIS (includes but not limited to):   GERD, gastritis, H. pylori, peptic ulcer disease, doubt appendicitis, colitis, diverticulitis, bowel obstruction, perforation, ACS ? ? ?PLAN: We will obtain CBC, CMP, lipase, urinalysis.  Will give GI cocktail, Zofran for symptomatic relief. ? ? ?MEDICATIONS GIVEN IN ED: ?Medications  ?ondansetron (ZOFRAN-ODT) disintegrating tablet 4 mg (has no administration in time range)  ?alum & mag hydroxide-simeth (MAALOX/MYLANTA) 200-200-20 MG/5ML suspension 30 mL (has no administration in time range)  ?  And  ?lidocaine (XYLOCAINE) 2 % viscous mouth solution 15 mL (has no administration in time range)  ? ? ? ?ED COURSE: Patient's labs show mild leukocytosis which is chronic and improved compared to previous in 2018.  Normal electrolytes, renal function, LFTs, lipase.  Urine shows no sign of infection or ketones to suggest dehydration.  Patient tolerating p.o. here and reports feeling better.  Will discharge with prescriptions of Zofran, Protonix. ? ? ?At this time, I do not feel there is any life-threatening condition present. I reviewed all nursing notes, vitals, pertinent previous records.  All lab and urine  results, EKGs, imaging ordered have been independently reviewed and interpreted by myself.  I reviewed all available radiology reports from any imaging ordered this visit.  Based on my assessment, I feel the patient is safe to be discharged home without further emergent workup and can continue workup as an outpatient as needed. Discussed all findings, treatment plan as well as usual and customary return precautions with patient.  They verbalize understanding and are comfortable with this plan.  Outpatient follow-up has been provided as needed.  All questions have been answered. ? ? ? ?CONSULTS: No admission needed at this time given patient feeling better and work-up has been reassuring. ? ? ?OUTSIDE RECORDS REVIEWED: Reviewed patient's last admission in February 2018 for respiratory failure due to  influenza and bronchitis. ? ? ? ? ? ? ? ? ?FINAL CLINICAL IMPRESSION(S) / ED DIAGNOSES  ? ?Final diagnoses:  ?Nausea  ?Gastroesophageal reflux disease, unspecified whether esophagitis present  ?Homelessness  ? ? ? ?Rx / DC Orders  ? ?ED Discharge Orders   ? ?      Ordered  ?  ondansetron (ZOFRAN-ODT) 4 MG disintegrating tablet  Every 6 hours PRN       ? 02/27/22 0350  ?  pantoprazole (PROTONIX) 40 MG tablet  Daily       ? 02/27/22 0350  ? ?  ?  ? ?  ? ? ? ?Note:  This document was prepared using Dragon voice recognition software and may include unintentional dictation errors. ?  ?Chealsea Paske, Layla Maw, DO ?02/27/22 0350 ? ?

## 2022-10-28 ENCOUNTER — Emergency Department
Admission: EM | Admit: 2022-10-28 | Discharge: 2022-10-28 | Discharge status: Against Medical Advice | Payer: Medicaid Other

## 2022-10-28 NOTE — ED Notes (Signed)
No answer when called several times from lobby 

## 2023-01-30 ENCOUNTER — Ambulatory Visit: Payer: Medicaid Other | Admitting: Physician Assistant

## 2023-02-24 ENCOUNTER — Other Ambulatory Visit: Payer: Self-pay | Admitting: *Deleted

## 2023-02-24 DIAGNOSIS — Z87891 Personal history of nicotine dependence: Secondary | ICD-10-CM

## 2023-02-24 DIAGNOSIS — Z122 Encounter for screening for malignant neoplasm of respiratory organs: Secondary | ICD-10-CM

## 2023-02-24 DIAGNOSIS — F17210 Nicotine dependence, cigarettes, uncomplicated: Secondary | ICD-10-CM

## 2023-03-19 ENCOUNTER — Ambulatory Visit: Admission: RE | Admit: 2023-03-19 | Payer: Medicaid Other | Source: Ambulatory Visit

## 2023-03-19 ENCOUNTER — Encounter: Payer: Medicaid Other | Admitting: Primary Care

## 2023-10-06 ENCOUNTER — Other Ambulatory Visit: Payer: Self-pay

## 2023-10-06 DIAGNOSIS — F17210 Nicotine dependence, cigarettes, uncomplicated: Secondary | ICD-10-CM

## 2023-10-06 DIAGNOSIS — Z122 Encounter for screening for malignant neoplasm of respiratory organs: Secondary | ICD-10-CM

## 2023-10-06 DIAGNOSIS — Z87891 Personal history of nicotine dependence: Secondary | ICD-10-CM

## 2023-10-14 ENCOUNTER — Ambulatory Visit: Payer: Medicaid Other | Admitting: Pediatrics

## 2023-10-16 ENCOUNTER — Ambulatory Visit: Payer: Medicaid Other | Admitting: Physician Assistant

## 2023-10-16 ENCOUNTER — Encounter: Payer: Self-pay | Admitting: Physician Assistant

## 2023-10-16 DIAGNOSIS — F17210 Nicotine dependence, cigarettes, uncomplicated: Secondary | ICD-10-CM

## 2023-10-16 NOTE — Progress Notes (Signed)
Virtual Visit via Telephone Note  I connected with Loma Sousa on 10/16/23 at  1:30 PM EST by telephone and verified that I am speaking with the correct person using two identifiers.  Location: Patient: Home Provider: Remotely   I discussed the limitations, risks, security and privacy concerns of performing an evaluation and management service by telephone and the availability of in person appointments. I also discussed with the patient that there may be a patient responsible charge related to this service. The patient expressed understanding and agreed to proceed.    Shared Decision Making Visit Lung Cancer Screening Program [phone])   Eligibility: Age 57 y.o. Pack Years Smoking History Calculation: 78 Recent History of coughing up blood: No Unexplained weight loss? No ( >Than 15 pounds within the last 6 months ) Prior History Lung / other cancer: No (Diagnosis within the last 5 years already requiring surveillance chest CT Scans). Smoking Status Current Smoker  Visit Components:     Discussion included one or more decision making aids. Yes Discussion included risk/benefits of screening. Yes Discussion included potential follow up diagnostic testing for abnormal scans. Yes Discussion included meaning and risk of Over Diagnosis. Yes Discussion included meaning and risk of False Positives. Yes Discussion included meaning of total radiation exposure. Yes  Counseling Included: Importance of adherence to annual lung cancer LDCT screening. Yes Impact of comorbidities on ability to participate in the program. Yes Ability and willingness to under diagnostic treatment. Yes  Smoking Cessation Counseling: Current Smokers:  Discussed importance of smoking cessation. Yes Information about tobacco cessation classes and interventions provided to patient. Yes Symptomatic Patient. No  Counseling: NA Diagnosis Code: Tobacco Use Z72.0 Asymptomatic Patient Yes   Counseling (Intermediate  counseling: > three minutes counseling) [id number]   Rutherford Guys, PA-C

## 2023-10-16 NOTE — Patient Instructions (Signed)

## 2023-10-22 ENCOUNTER — Ambulatory Visit: Payer: Medicaid Other | Attending: Internal Medicine

## 2024-02-10 ENCOUNTER — Encounter: Payer: Self-pay | Admitting: *Deleted

## 2024-05-26 ENCOUNTER — Other Ambulatory Visit: Payer: Self-pay

## 2024-05-26 DIAGNOSIS — Z122 Encounter for screening for malignant neoplasm of respiratory organs: Secondary | ICD-10-CM

## 2024-05-26 DIAGNOSIS — F17210 Nicotine dependence, cigarettes, uncomplicated: Secondary | ICD-10-CM

## 2024-05-26 DIAGNOSIS — Z87891 Personal history of nicotine dependence: Secondary | ICD-10-CM

## 2024-06-15 NOTE — Progress Notes (Signed)
 This encounter was created in error - please disregard.

## 2024-06-24 ENCOUNTER — Ambulatory Visit
Admission: RE | Admit: 2024-06-24 | Discharge: 2024-06-24 | Disposition: A | Discharge status: Home / Self Care | Source: Ambulatory Visit | Attending: Acute Care | Admitting: Acute Care

## 2024-06-24 DIAGNOSIS — Z87891 Personal history of nicotine dependence: Secondary | ICD-10-CM | POA: Diagnosis present

## 2024-06-24 DIAGNOSIS — F17210 Nicotine dependence, cigarettes, uncomplicated: Secondary | ICD-10-CM | POA: Diagnosis present

## 2024-06-24 DIAGNOSIS — Z122 Encounter for screening for malignant neoplasm of respiratory organs: Secondary | ICD-10-CM | POA: Insufficient documentation

## 2024-07-13 ENCOUNTER — Other Ambulatory Visit: Payer: Self-pay

## 2024-07-13 DIAGNOSIS — F17210 Nicotine dependence, cigarettes, uncomplicated: Secondary | ICD-10-CM

## 2024-07-13 DIAGNOSIS — Z87891 Personal history of nicotine dependence: Secondary | ICD-10-CM

## 2024-07-13 DIAGNOSIS — Z122 Encounter for screening for malignant neoplasm of respiratory organs: Secondary | ICD-10-CM
